# Patient Record
Sex: Male | Born: 1980 | Race: White | Hispanic: No | Marital: Married | Smoking: Former smoker
Health system: Southern US, Community
[De-identification: ages and names within clinical notes are randomized; demographics above are authoritative.]

## PROBLEM LIST (undated history)

## (undated) DIAGNOSIS — N289 Disorder of kidney and ureter, unspecified: Secondary | ICD-10-CM

## (undated) DIAGNOSIS — R918 Other nonspecific abnormal finding of lung field: Secondary | ICD-10-CM

## (undated) DIAGNOSIS — G118 Other hereditary ataxias: Secondary | ICD-10-CM

## (undated) HISTORY — DX: Other nonspecific abnormal finding of lung field: R91.8

## (undated) HISTORY — PX: HAND SURGERY: SHX662

---

## 2005-09-09 ENCOUNTER — Emergency Department: Payer: Self-pay | Admitting: Emergency Medicine

## 2006-09-26 ENCOUNTER — Emergency Department: Payer: Self-pay | Admitting: Emergency Medicine

## 2008-01-04 ENCOUNTER — Emergency Department: Payer: Self-pay | Admitting: Emergency Medicine

## 2010-02-26 ENCOUNTER — Emergency Department: Payer: Self-pay | Admitting: Emergency Medicine

## 2010-09-13 ENCOUNTER — Emergency Department: Payer: Self-pay | Admitting: Emergency Medicine

## 2010-10-14 ENCOUNTER — Emergency Department: Payer: Self-pay | Admitting: Emergency Medicine

## 2011-01-19 ENCOUNTER — Emergency Department: Payer: Self-pay | Admitting: Emergency Medicine

## 2013-04-15 ENCOUNTER — Emergency Department: Payer: Self-pay | Admitting: Emergency Medicine

## 2014-07-13 ENCOUNTER — Emergency Department: Payer: Self-pay | Admitting: Emergency Medicine

## 2015-06-10 ENCOUNTER — Encounter: Payer: Self-pay | Admitting: *Deleted

## 2015-06-10 ENCOUNTER — Emergency Department
Admission: EM | Admit: 2015-06-10 | Discharge: 2015-06-10 | Disposition: A | Discharge status: Home / Self Care | Payer: Self-pay | Attending: Emergency Medicine | Admitting: Emergency Medicine

## 2015-06-10 DIAGNOSIS — K029 Dental caries, unspecified: Secondary | ICD-10-CM | POA: Insufficient documentation

## 2015-06-10 DIAGNOSIS — Z720 Tobacco use: Secondary | ICD-10-CM | POA: Insufficient documentation

## 2015-06-10 DIAGNOSIS — K047 Periapical abscess without sinus: Secondary | ICD-10-CM

## 2015-06-10 MED ORDER — AMOXICILLIN 500 MG PO TABS
500.0000 mg | ORAL_TABLET | Freq: Three times a day (TID) | ORAL | Status: DC
Start: 2015-06-10 — End: 2019-03-20

## 2015-06-10 MED ORDER — HYDROCODONE-ACETAMINOPHEN 5-325 MG PO TABS: 1.0000 | ORAL_TABLET | ORAL | Status: DC | PRN

## 2015-06-10 NOTE — ED Notes (Signed)
Pt reports dental pain x 1 week, two days ago developed abscess to right upper side of mouth.

## 2015-06-10 NOTE — Discharge Instructions (Signed)
Dental Abscess °A dental abscess is a collection of infected fluid (pus) from a bacterial infection in the inner part of the tooth (pulp). It usually occurs at the end of the tooth's root.  °CAUSES  °· Severe tooth decay. °· Trauma to the tooth that allows bacteria to enter into the pulp, such as a broken or chipped tooth. °SYMPTOMS  °· Severe pain in and around the infected tooth. °· Swelling and redness around the abscessed tooth or in the mouth or face. °· Tenderness. °· Pus drainage. °· Bad breath. °· Bitter taste in the mouth. °· Difficulty swallowing. °· Difficulty opening the mouth. °· Nausea. °· Vomiting. °· Chills. °· Swollen neck glands. °DIAGNOSIS  °· A medical and dental history will be taken. °· An examination will be performed by tapping on the abscessed tooth. °· X-rays may be taken of the tooth to identify the abscess. °TREATMENT °The goal of treatment is to eliminate the infection. You may be prescribed antibiotic medicine to stop the infection from spreading. A root canal may be performed to save the tooth. If the tooth cannot be saved, it may be pulled (extracted) and the abscess may be drained.  °HOME CARE INSTRUCTIONS °· Only take over-the-counter or prescription medicines for pain, fever, or discomfort as directed by your caregiver. °· Rinse your mouth (gargle) often with salt water (¼ tsp salt in 8 oz [250 ml] of warm water) to relieve pain or swelling. °· Do not drive after taking pain medicine (narcotics). °· Do not apply heat to the outside of your face. °· Return to your dentist for further treatment as directed. °SEEK MEDICAL CARE IF: °· Your pain is not helped by medicine. °· Your pain is getting worse instead of better. °SEEK IMMEDIATE MEDICAL CARE IF: °· You have a fever or persistent symptoms for more than 2-3 days. °· You have a fever and your symptoms suddenly get worse. °· You have chills or a very bad headache. °· You have problems breathing or swallowing. °· You have trouble  opening your mouth. °· You have swelling in the neck or around the eye. °Document Released: 10/16/2005 Document Revised: 07/10/2012 Document Reviewed: 01/24/2011 °ExitCare® Patient Information ©2015 [employer]. This information is not intended to replace advice given to you by your health care provider. Make sure you discuss any questions you have with your health care provider. ° ° °OPTIONS FOR DENTAL FOLLOW UP CARE ° °[place] Department of Health and Human Services - Local Safety Net Dental Clinics °http://www.ncdhhs.gov/dph/oralhealth/services/safetynetclinics.htm °  °[facility] (336-562-3123) ° °[facility] [place] (919-933-9087) ° °[facility] (919-663-1744 ext 237) ° °[facility] (336-570-6415) ° °[facility] (919-968-2025) °This clinic caters to the indigent population and is on a lottery system. °Location: °[facility], [address], [place] °Clinic Hours: °Wednesdays from 6pm - 9pm, patients seen by a lottery system. °For dates, call or go to www.med.[facility].edu/[facility]/patients/Dental-[facility] °Services: °Cleanings, fillings and simple extractions. °Payment Options: °DENTAL WORK IS FREE OF CHARGE. Bring proof of income or support. °Best way to get seen: °Arrive at 5:15 pm - this is a lottery, NOT first come/first serve, so arriving earlier will not increase your chances of being seen. °  °  °[facility] °919-537-3737 °Select option 1 for emergencies °  °Location: °[facility], [address], [place] °Clinic Hours: °No walk-ins accepted - call the day before to schedule an appointment. °Check in times   are 9:30 am and 1:30 pm. °Services: °Simple extractions, temporary fillings, pulpectomy/pulp debridement, uncomplicated abscess drainage. °Payment Options: °PAYMENT IS DUE AT THE TIME OF SERVICE.  Fee is usually $100-200, additional surgical procedures (e.g. abscess drainage) may  be extra. °Cash, checks, Visa/MasterCard accepted.  Can file Medicaid if patient is covered for dental - patient should call case worker to check. °No discount for [facility] patients. °Best way to get seen: °MUST call the day before and get onto the schedule. Can usually be seen the next 1-2 days. No walk-ins accepted. °  °  °[facility] °919-933-9087 °  °Location: °[facility], [address], [place] °Clinic Hours: °M, W, Th, F 8am or 1:30pm, Tues 9a or 1:30 - first come/first served. °Services: °Simple extractions, temporary fillings, uncomplicated abscess drainage.  You do not need to be an [place] resident. °Payment Options: °PAYMENT IS DUE AT THE TIME OF SERVICE. °Dental insurance, otherwise sliding scale - bring proof of income or support. °Depending on income and treatment needed, cost is usually $50-200. °Best way to get seen: °Arrive early as it is first come/first served. °  °  °[facility] °919-542-1641 °  °Location: °[address] °Clinic Hours: °Mon-Thu 8a-5p °Services: °Most basic dental services including extractions and fillings. °Payment Options: °PAYMENT IS DUE AT THE TIME OF SERVICE. °Sliding scale, up to 50% off - bring proof if income or support. °Medicaid with dental option accepted. °Best way to get seen: °Call to schedule an appointment, can usually be seen within 2 weeks OR they will try to see walk-ins - show up at 8a or 2p (you may have to wait). °  °  °[facility] °919-245-2435 °[place] RESIDENTS ONLY °  °Location: °[facility], [address], [place], [place] [address] °Clinic Hours: By appointment only. °Monday - Thursday 8am-5pm, Friday 8am-12pm °Services: Cleanings, fillings, extractions. °Payment Options: °PAYMENT IS DUE AT THE TIME OF SERVICE. °Cash, Visa or MasterCard. Sliding scale - $30 minimum per service. °Best way to get seen: °Come in to  office, complete packet and make an appointment - need proof of income °or support monies for each household member and proof of [place] residence. °Usually takes about a month to get in. °  °  °[facility] °[phone] °  °Location: °[address] [place] °Clinic Hours: Walk-in Urgent Care Dental Services are offered Monday-Friday mornings only. °The numbers of emergencies accepted daily is limited to the number of °providers available. °Maximum 15 - Mondays, Wednesdays & Thursdays °Maximum 10 - Tuesdays & Fridays °Services: °You do not need to be a [place] resident to be seen for a dental emergency. °Emergencies are defined as pain, swelling, abnormal bleeding, or dental trauma. Walkins will receive x-rays if needed. °NOTE: Dental cleaning is not an emergency. °Payment Options: °PAYMENT IS DUE AT THE TIME OF SERVICE. °Minimum co-pay is $40.00 for uninsured patients. °Minimum co-pay is $3.00 for Medicaid with dental coverage. °Dental Insurance is accepted and must be presented at time of visit. °Medicare does not cover dental. °Forms of payment: Cash, credit card, checks. °Best way to get seen: °If not previously registered with the clinic, walk-in dental registration begins at 7:15 am and is on a first come/first serve basis. °If previously registered with the clinic, call to make an appointment. °  °  °[facility] °919-776-4359 °[place] RESIDENTS ONLY °  °Location: °[address],   [place], [place] °Clinic Hours: °Mon-Thu 10a-2p °Services: Extractions only! °Payment Options: °FREE (donations accepted) - bring proof of income or support °Best way to get seen: °Call and schedule an appointment OR come at 8am on the 1st Monday of every month (except for holidays) when it is first come/first served. °  °  °[facility] °[phone] °  °Location: °[address], [place] °Clinic Hours: °Friday mornings °Services, Payment Options, Best way to get  seen: °Call for info °

## 2015-06-10 NOTE — ED Provider Notes (Signed)
[facility] Emergency Department Provider Note ____________________________________________  Time seen: Approximately 2:57 PM  I have reviewed the triage vital signs and the nursing notes.   HISTORY  Chief Complaint Abscess   HPI Eric Webster is a 34 y.o. male who presents to the emergency department for dental pain for the past 7-10 days. He states that he has chronic dental pain that has suddenly worsened. He has not been taking anything for pain he has not been on any antibiotic over the last month. States that he cannot afford to go to the dentist.   History reviewed. No pertinent past medical history.  There are no active problems to display for this patient.   History reviewed. No pertinent past surgical history.  Current Outpatient Rx  Name  Route  Sig  Dispense  Refill  . amoxicillin (AMOXIL) 500 MG tablet   Oral   Take 1 tablet (500 mg total) by mouth 3 (three) times daily.   30 tablet   0   . HYDROcodone-acetaminophen (NORCO/VICODIN) 5-325 MG per tablet   Oral   Take 1 tablet by mouth every 4 (four) hours as needed.   12 tablet   0     Allergies Review of patient's allergies indicates no known allergies.  No family history on file.  Social History Social History  Substance Use Topics  . Smoking status: Current Every Day Smoker  . Smokeless tobacco: None  . Alcohol Use: Yes    Review of Systems Constitutional: No fever/chills Eyes: No visual changes. ENT: No sore throat. Cardiovascular: Denies chest pain. Respiratory: Denies shortness of breath. Gastrointestinal: No abdominal pain.  No nausea, no vomiting.  Genitourinary: Negative for dysuria. Musculoskeletal: Negative for back pain. Skin: Negative for rash. Neurological: Negative for headaches, focal weakness or numbness. 10-point ROS otherwise negative.  ____________________________________________   PHYSICAL EXAM:  VITAL SIGNS: ED Triage Vitals  Enc  Vitals Group     BP 06/10/15 1354 147/92 mmHg     Pulse Rate 06/10/15 1354 91     Resp 06/10/15 1354 16     Temp 06/10/15 1354 98.5 F (36.9 C)     Temp Source 06/10/15 1354 Oral     SpO2 06/10/15 1354 99 %     Weight 06/10/15 1408 190 lb (86.183 kg)     Height 06/10/15 1408 6' (1.829 m)     Head Cir --      Peak Flow --      Pain Score 06/10/15 1353 10     Pain Loc --      Pain Edu? --      Excl. in GC? --     Constitutional: Alert and oriented. Well appearing and in no acute distress. Eyes: Conjunctivae are normal. PERRL. EOMI. Head: Atraumatic. Nose: No congestion/rhinnorhea. Mouth/Throat: Mucous membranes are moist.  Oropharynx non-erythematous. Periodontal Exam: Widespread chronic dental decay with swelling around gumline to numbers 2 and 3. Neck: No stridor.  Hematological/Lymphatic/Immunilogical: No cervical lymphadenopathy. Cardiovascular:   Good peripheral circulation. Respiratory: Normal respiratory effort.  No retractions. Musculoskeletal: No lower extremity tenderness nor edema.  No joint effusions. Neurologic:  Normal speech and language. No gross focal neurologic deficits are appreciated. Speech is normal. No gait instability. Skin:  Skin is warm, dry and intact. No rash noted. Psychiatric: Mood and affect are normal. Speech and behavior are normal.  ____________________________________________   LABS (all labs ordered are listed, but only abnormal results are displayed)  Labs Reviewed - No data  to display ____________________________________________   RADIOLOGY  Not indicated ____________________________________________   PROCEDURES  Procedure(s) performed: None  Critical Care performed: No  ____________________________________________   INITIAL IMPRESSION / ASSESSMENT AND PLAN / ED COURSE  Pertinent labs & imaging results that were available during my care of the patient were reviewed by me and considered in my medical decision making (see  chart for details).  Patient was advised to see the dentist within 14 days. Also advised to take the antibiotic until finished. Instructed to return to the ER for symptoms that change or worsen if you are unable to schedule an appointment. ____________________________________________   FINAL CLINICAL IMPRESSION(S) / ED DIAGNOSES  Final diagnoses:  Dental abscess       Chinita Pester, FNP 06/10/15 1459  Emily Filbert, MD 06/10/15 1558

## 2015-12-29 ENCOUNTER — Emergency Department
Admission: EM | Admit: 2015-12-29 | Discharge: 2015-12-29 | Disposition: A | Discharge status: Home / Self Care | Payer: Self-pay | Attending: Emergency Medicine | Admitting: Emergency Medicine

## 2015-12-29 ENCOUNTER — Encounter: Payer: Self-pay | Admitting: Emergency Medicine

## 2015-12-29 DIAGNOSIS — B349 Viral infection, unspecified: Secondary | ICD-10-CM | POA: Insufficient documentation

## 2015-12-29 DIAGNOSIS — F17210 Nicotine dependence, cigarettes, uncomplicated: Secondary | ICD-10-CM | POA: Insufficient documentation

## 2015-12-29 DIAGNOSIS — Z792 Long term (current) use of antibiotics: Secondary | ICD-10-CM | POA: Insufficient documentation

## 2015-12-29 LAB — COMPREHENSIVE METABOLIC PANEL
ALK PHOS: 90 U/L (ref 38–126)
ALT: 24 U/L (ref 17–63)
AST: 25 U/L (ref 15–41)
Albumin: 4.1 g/dL (ref 3.5–5.0)
Anion gap: 7 (ref 5–15)
BUN: 11 mg/dL (ref 6–20)
CALCIUM: 9.1 mg/dL (ref 8.9–10.3)
CO2: 24 mmol/L (ref 22–32)
CREATININE: 1.01 mg/dL (ref 0.61–1.24)
Chloride: 104 mmol/L (ref 101–111)
GFR calc non Af Amer: >60 mL/min (ref >60)
Glucose, Bld: 119 mg/dL — ABNORMAL HIGH (ref 65–99)
Potassium: 3.8 mmol/L (ref 3.5–5.1)
SODIUM: 135 mmol/L (ref 135–145)
TOTAL PROTEIN: 8 g/dL (ref 6.5–8.1)
Total Bilirubin: 0.8 mg/dL (ref 0.3–1.2)

## 2015-12-29 LAB — RAPID INFLUENZA A&B ANTIGENS: Influenza A (ARMC): NEGATIVE

## 2015-12-29 LAB — CBC
HCT: 47.3 % (ref 40.0–52.0)
Hemoglobin: 16.1 g/dL (ref 13.0–18.0)
MCH: 29.4 pg (ref 26.0–34.0)
MCHC: 34.1 g/dL (ref 32.0–36.0)
MCV: 86.3 fL (ref 80.0–100.0)
PLATELETS: 204 10*3/uL (ref 150–440)
RBC: 5.48 MIL/uL (ref 4.40–5.90)
RDW: 13.4 % (ref 11.5–14.5)
WBC: 7.9 10*3/uL (ref 3.8–10.6)

## 2015-12-29 LAB — URINALYSIS COMPLETE WITH MICROSCOPIC (ARMC ONLY)
Bilirubin Urine: NEGATIVE
Glucose, UA: NEGATIVE mg/dL
HGB URINE DIPSTICK: NEGATIVE
Leukocytes, UA: NEGATIVE
NITRITE: NEGATIVE
PH: 5 (ref 5.0–8.0)
Protein, ur: 30 mg/dL — AB
Specific Gravity, Urine: 1.03 (ref 1.005–1.030)

## 2015-12-29 LAB — LIPASE, BLOOD: LIPASE: 20 U/L (ref 11–51)

## 2015-12-29 LAB — RAPID INFLUENZA A&B ANTIGENS (ARMC ONLY): INFLUENZA B (ARMC): NEGATIVE

## 2015-12-29 MED ORDER — ONDANSETRON 4 MG PO TBDP
ORAL_TABLET | ORAL | Status: AC
  Filled 2015-12-29: qty 1

## 2015-12-29 MED ORDER — METOCLOPRAMIDE HCL 10 MG PO TABS
10.0000 mg | ORAL_TABLET | Freq: Four times a day (QID) | ORAL | Status: DC | PRN
Start: 2015-12-29 — End: 2019-03-20

## 2015-12-29 MED ORDER — ONDANSETRON 4 MG PO TBDP
4.0000 mg | ORAL_TABLET | Freq: Once | ORAL | Status: AC
  Administered 2015-12-29: 4 mg via ORAL

## 2015-12-29 NOTE — ED Notes (Signed)
Pt reports vomiting, diarrhea since Monday. Pt states 3x vomiting today, with 2x diarrhea. Pt states fever over 101 yesterday, normal today.

## 2015-12-29 NOTE — ED Provider Notes (Signed)
[facility] Emergency Department Provider Note  ____________________________________________  Time seen: Approximately 230 PM  I have reviewed the triage vital signs and the nursing notes.   HISTORY  Chief Complaint Fever; Emesis; and Diarrhea    HPI Eric Webster is a 35 y.o. male without any chronic medical problems was presenting today with fever, body aches, nausea vomiting and diarrhea. He says the symptoms began this past Sunday. Multiple people have been sick in his family. He denies any of them having a formal diagnosis of influenza.Says that he also has associated runny nose and cough. No abdominal pain. Says that he had a fever to 101 yesterday but it has abated today. No blood or bilious vomitus. No blood in his diarrhea. Vomited 3-4 times today and one or 2 episodes of diarrhea earlier today. No recent travel or antibiotics. No camping or drinking from streams. Also with some mild lightheadedness when going from sitting to standing position. Also associated with generalized weakness.   Says that he does not carry a formal diagnosis of hypertension but says that every time she is a Dr. Jennette Banker comment about his blood pressure being elevated.   History reviewed. No pertinent past medical history.  There are no active problems to display for this patient.   Past Surgical History  Procedure Laterality Date  . Hand surgery Right     Current Outpatient Rx  Name  Route  Sig  Dispense  Refill  . amoxicillin (AMOXIL) 500 MG tablet   Oral   Take 1 tablet (500 mg total) by mouth 3 (three) times daily.   30 tablet   0   . HYDROcodone-acetaminophen (NORCO/VICODIN) 5-325 MG per tablet   Oral   Take 1 tablet by mouth every 4 (four) hours as needed.   12 tablet   0     Allergies Review of patient's allergies indicates no known allergies.  No family history on file.  Social History Social History  Substance Use Topics  . Smoking status:  Current Every Day Smoker -- 1.50 packs/day    Types: Cigarettes  . Smokeless tobacco: None  . Alcohol Use: Yes     Comment: 1 pint liquor every weekend    Review of Systems Constitutional:  fever/chills Eyes: No visual changes. ENT: No sore throat. Cardiovascular: Denies chest pain. Respiratory: Dry cough Gastrointestinal: No abdominal pain.   No constipation. Genitourinary: Negative for dysuria. Musculoskeletal: Negative for back pain. Skin: Negative for rash. Neurological: Negative for headaches, focal weakness or numbness.  10-point ROS otherwise negative.  ____________________________________________   PHYSICAL EXAM:  VITAL SIGNS: ED Triage Vitals  Enc Vitals Group     BP 12/29/15 1353 167/144 mmHg     Pulse Rate 12/29/15 1353 95     Resp --      Temp 12/29/15 1353 98.4 F (36.9 C)     Temp Source 12/29/15 1353 Oral     SpO2 12/29/15 1353 96 %     Weight 12/29/15 1353 200 lb (90.719 kg)     Height 12/29/15 1353 6' (1.829 m)     Head Cir --      Peak Flow --      Pain Score --      Pain Loc --      Pain Edu? --      Excl. in GC? --     Constitutional: Alert and oriented. Well appearing and in no acute distress. Eyes: Conjunctivae are normal. PERRL. EOMI. Head: Atraumatic.  Nose: No congestion/rhinnorhea. Mouth/Throat: Mucous membranes are moist.  Oropharynx non-erythematous. Neck: No stridor.   Cardiovascular: Normal rate, regular rhythm. Grossly normal heart sounds.  Good peripheral circulation. Respiratory: Normal respiratory effort.  No retractions. Lungs CTAB. Gastrointestinal: Soft and nontender. No distention. no CVA tenderness. Musculoskeletal: No lower extremity tenderness nor edema.  No joint effusions. Neurologic:  Normal speech and language. No gross focal neurologic deficits are appreciated. No gait instability. Skin:  Skin is warm, dry and intact. No rash noted. Psychiatric: Mood and affect are normal. Speech and behavior are  normal.  ____________________________________________   LABS (all labs ordered are listed, but only abnormal results are displayed)  Labs Reviewed  COMPREHENSIVE METABOLIC PANEL - Abnormal; Notable for the following:    Glucose, Bld 119 (*)    All other components within normal limits  URINALYSIS COMPLETEWITH MICROSCOPIC ([facility] ONLY) - Abnormal; Notable for the following:    Color, Urine AMBER (*)    APPearance CLEAR (*)    Ketones, ur TRACE (*)    Protein, ur 30 (*)    Bacteria, UA RARE (*)    Squamous Epithelial / LPF 0-5 (*)    All other components within normal limits  RAPID INFLUENZA A&B ANTIGENS ([facility] ONLY)  LIPASE, BLOOD  CBC   ____________________________________________  EKG   ____________________________________________  RADIOLOGY   ____________________________________________   PROCEDURES   ____________________________________________   INITIAL IMPRESSION / ASSESSMENT AND PLAN / ED COURSE  Pertinent labs & imaging results that were available during my care of the patient were reviewed by me and considered in my medical decision making (see chart for details).  ----------------------------------------- 3:17 PM on 12/29/2015 -----------------------------------------  Patient is resting comfortably this time. After Zofran and is able to tolerate by mouth fluids. He denies any nausea at this time. Very reassuring lab workup including a negative rapid flu screen. Likely viral illness. I counseled the patient to make sure to stay hydrated with plenty of fluids at home. Also recheck his blood pressure which was 109/77. I will not start him on antihypertensive at this point because of the second blood pressure. However, we did discuss following up at an outpatient appointment. He'll be given the [facility] clinic contact information for follow-up. I will also discharge him home with Reglan. ____________________________________________   FINAL CLINICAL IMPRESSION(S)  / ED DIAGNOSES  Viral syndrome    Myrna Blazer, MD 12/29/15 [phone]

## 2015-12-29 NOTE — ED Notes (Signed)
Pt with fever and feeling ill starting on Sunday.  Nausea, vomiting and diarrhea started 1 day ago.  Pt states he has thrown up 3-4 times today.

## 2015-12-29 NOTE — ED Notes (Signed)
Pt discharged home after verbalizing understanding of discharge instructions; nad noted. 

## 2015-12-29 NOTE — ED Notes (Signed)
Called in waiting room with no answer 

## 2016-10-24 ENCOUNTER — Emergency Department
Admission: EM | Admit: 2016-10-24 | Discharge: 2016-10-24 | Disposition: A | Discharge status: Home / Self Care | Payer: Medicaid Other | Attending: Emergency Medicine | Admitting: Emergency Medicine

## 2016-10-24 DIAGNOSIS — F17210 Nicotine dependence, cigarettes, uncomplicated: Secondary | ICD-10-CM | POA: Insufficient documentation

## 2016-10-24 DIAGNOSIS — K047 Periapical abscess without sinus: Secondary | ICD-10-CM | POA: Insufficient documentation

## 2016-10-24 DIAGNOSIS — Z79899 Other long term (current) drug therapy: Secondary | ICD-10-CM | POA: Insufficient documentation

## 2016-10-24 MED ORDER — IBUPROFEN 600 MG PO TABS: 600.0000 mg | ORAL_TABLET | Freq: Three times a day (TID) | ORAL | 0 refills | Status: DC | PRN

## 2016-10-24 MED ORDER — AMOXICILLIN 500 MG PO CAPS: 500.0000 mg | ORAL_CAPSULE | Freq: Three times a day (TID) | ORAL | 0 refills | Status: DC

## 2016-10-24 MED ORDER — IBUPROFEN 600 MG PO TABS
600.0000 mg | ORAL_TABLET | Freq: Once | ORAL | Status: AC
  Administered 2016-10-24: 600 mg via ORAL
  Filled 2016-10-24: qty 1

## 2016-10-24 MED ORDER — LIDOCAINE VISCOUS 2 % MT SOLN
15.0000 mL | Freq: Once | OROMUCOSAL | Status: AC
  Administered 2016-10-24: 15 mL via OROMUCOSAL
  Filled 2016-10-24: qty 15

## 2016-10-24 MED ORDER — OXYCODONE-ACETAMINOPHEN 5-325 MG PO TABS
1.0000 | ORAL_TABLET | Freq: Once | ORAL | Status: AC
  Administered 2016-10-24: 1 via ORAL
  Filled 2016-10-24: qty 1

## 2016-10-24 MED ORDER — TRAMADOL HCL 50 MG PO TABS: 50.0000 mg | ORAL_TABLET | Freq: Four times a day (QID) | ORAL | 0 refills | Status: AC | PRN

## 2016-10-24 MED ORDER — AMOXICILLIN 500 MG PO CAPS
500.0000 mg | ORAL_CAPSULE | Freq: Once | ORAL | Status: AC
  Administered 2016-10-24: 500 mg via ORAL
  Filled 2016-10-24: qty 1

## 2016-10-24 NOTE — ED Triage Notes (Signed)
Dental pain to right side of mouth; has not seen dentist. Pain X 1 week to right side of face, swelling. No RR distress. Pt alert and oriented X4, active, cooperative, pt in NAD. RR even and unlabored, color WNL.

## 2016-10-24 NOTE — Discharge Instructions (Signed)
OPTIONS FOR DENTAL FOLLOW UP CARE ° °[place] Department of Health and Human Services - Local Safety Net Dental Clinics °http://www.ncdhhs.gov/dph/oralhealth/services/safetynetclinics.htm °  °[facility] (336-562-3123) ° °[facility] [place] ([phone]) ° °[facility] ([phone] ext [phone]) ° ° [facility] ([phone]) ° °[facility] ([phone]) °This clinic caters to the indigent population and is on a lottery system. °Location: °[facility], [address], [place] °Clinic Hours: °Wednesdays from 6pm - 9pm, patients seen by a lottery system. °For dates, call or go to [email or web address] °Services: °Cleanings, fillings and simple extractions. °Payment Options: °DENTAL WORK IS FREE OF CHARGE. Bring proof of income or support. °Best way to get seen: °Arrive at 5:15 pm - this is a lottery, NOT first come/first serve, so arriving earlier will not increase your chances of being seen. °  °  °[facility] °[phone] °Select option 1 for emergencies °  °Location: °[facility], [address], [place] °Clinic Hours: °No walk-ins accepted - call the day before to schedule an appointment. °Check in times are 9:30 am and 1:30 pm. °Services: °Simple extractions, temporary fillings, pulpectomy/pulp debridement, uncomplicated abscess drainage. °Payment Options: °PAYMENT IS DUE AT THE TIME OF SERVICE.  Fee is usually $100-200, additional surgical procedures (e.g. abscess drainage) may be extra. °Cash, checks, Visa/MasterCard accepted.  Can file Medicaid if patient is covered for dental - patient should call case worker to check. °No discount for [facility] patients. °Best way to get seen: °MUST call the day before and get onto the schedule. Can usually be seen the next 1-2 days. No walk-ins accepted. °  °  °[facility] °[phone] °   °Location: °[facility], [address], [place] °Clinic Hours: °M, W, Th, F 8am or 1:30pm, Tues 9a or 1:30 - first come/first served. °Services: °Simple extractions, temporary fillings, uncomplicated abscess drainage.  You do not need to be an [place] resident. °Payment Options: °PAYMENT IS DUE AT THE TIME OF SERVICE. °Dental insurance, otherwise sliding scale - bring proof of income or support. °Depending on income and treatment needed, cost is usually $50-200. °Best way to get seen: °Arrive early as it is first come/first served. °  °  °[facility] °[phone] °  °Location: °[address] °Clinic Hours: °Mon-Thu 8a-5p °Services: °Most basic dental services including extractions and fillings. °Payment Options: °PAYMENT IS DUE AT THE TIME OF SERVICE. °Sliding scale, up to 50% off - bring proof if income or support. °Medicaid with dental option accepted. °Best way to get seen: °Call to schedule an appointment, can usually be seen within 2 weeks OR they will try to see walk-ins - show up at 8a or 2p (you may have to wait). °  °  °[facility] °[phone] °[place] RESIDENTS ONLY °  °Location: °[facility], [address], [place], [place] [address] °Clinic Hours: By appointment only. °Monday - Thursday 8am-5pm, Friday 8am-12pm °Services: Cleanings, fillings, extractions. °Payment Options: °PAYMENT IS DUE AT THE TIME OF SERVICE. °Cash, Visa or MasterCard. Sliding scale - $30 minimum per service. °Best way to get seen: °Come in to office, complete packet and make an appointment - need proof of income °or support monies for each household member and proof of [place] residence. °Usually takes about a month to get in. °  °  °[facility] °[phone] °  °Location: °[address]   [place] °Clinic Hours: Walk-in Urgent Care Dental Services are offered Monday-Friday  mornings only. °The numbers of emergencies accepted daily is limited to the number of °providers available. °Maximum 15 - Mondays, Wednesdays & Thursdays °Maximum 10 - Tuesdays & Fridays °Services: °You do not need to be a [place] resident to be seen for a dental emergency. °Emergencies are defined as pain, swelling, abnormal bleeding, or dental trauma. Walkins will receive x-rays if needed. °NOTE: Dental cleaning is not an emergency. °Payment Options: °PAYMENT IS DUE AT THE TIME OF SERVICE. °Minimum co-pay is $40.00 for uninsured patients. °Minimum co-pay is $3.00 for Medicaid with dental coverage. °Dental Insurance is accepted and must be presented at time of visit. °Medicare does not cover dental. °Forms of payment: Cash, credit card, checks. °Best way to get seen: °If not previously registered with the clinic, walk-in dental registration begins at 7:15 am and is on a first come/first serve basis. °If previously registered with the clinic, call to make an appointment. °  °  °[facility] °[phone] °[place] RESIDENTS ONLY °  °Location: °[address], [place], [place] °Clinic Hours: °Mon-Thu 10a-2p °Services: Extractions only! °Payment Options: °FREE (donations accepted) - bring proof of income or support °Best way to get seen: °Call and schedule an appointment OR come at 8am on the 1st Monday of every month (except for holidays) when it is first come/first served. °  °  °[facility] °[phone] °  °Location: °[address], [place] °Clinic Hours: °Friday mornings °Services, Payment Options, Best way to get seen: °Call for info °

## 2016-10-24 NOTE — ED Notes (Signed)
Pt offered wheelchair, denies.  

## 2016-10-24 NOTE — ED Notes (Signed)
Pt complains of right sided mouth pain/ dental pain, pt denies fever, right side of face is swollen

## 2016-10-24 NOTE — ED Provider Notes (Signed)
[facility] Emergency Department Provider Note   ____________________________________________   None    (approximate)  I have reviewed the triage vital signs and the nursing notes.   HISTORY  Chief Complaint Dental Pain    HPI Eric Webster is a 35 y.o. male patient complain of pain and edema to the right side of his mouth for 1 week. Patient has a history of devitalized fractured teeth. Patient has not seen a dentist secondary to lack of insurance. Patient denies any fever associated this complaint. Patient described a pain as "sharp".Patient rates the pain as a 10 over 10. No palliative measures for this complaint.   History reviewed. No pertinent past medical history.  There are no active problems to display for this patient.   Past Surgical History:  Procedure Laterality Date  . HAND SURGERY Right     Prior to Admission medications   Medication Sig Start Date End Date Taking? Authorizing Provider  amoxicillin (AMOXIL) 500 MG capsule Take 1 capsule (500 mg total) by mouth 3 (three) times daily. 10/24/16   Joni Reiningonald K Smith, PA-C  amoxicillin (AMOXIL) 500 MG tablet Take 1 tablet (500 mg total) by mouth 3 (three) times daily. 06/10/15   Chinita Pesterari B Triplett, FNP  HYDROcodone-acetaminophen (NORCO/VICODIN) 5-325 MG per tablet Take 1 tablet by mouth every 4 (four) hours as needed. 06/10/15   Chinita Pesterari B Triplett, FNP  ibuprofen (ADVIL,MOTRIN) 600 MG tablet Take 1 tablet (600 mg total) by mouth every 8 (eight) hours as needed. 10/24/16   Joni Reiningonald K Smith, PA-C  metoCLOPramide (REGLAN) 10 MG tablet Take 1 tablet (10 mg total) by mouth every 6 (six) hours as needed. 12/29/15   Myrna Blazeravid Matthew Schaevitz, MD  traMADol (ULTRAM) 50 MG tablet Take 1 tablet (50 mg total) by mouth every 6 (six) hours as needed. 10/24/16 10/24/17  Joni Reiningonald K Smith, PA-C    Allergies Patient has no known allergies.  No family history on file.  Social History Social History  Substance Use  Topics  . Smoking status: Current Every Day Smoker    Packs/day: 1.50    Types: Cigarettes  . Smokeless tobacco: Not on file  . Alcohol use Yes     Comment: 1 pint liquor every weekend    Review of Systems Constitutional: No fever/chills Eyes: No visual changes. ZOX:WRUEAVENT:Dental pain. Cardiovascular: Denies chest pain. Respiratory: Denies shortness of breath. Gastrointestinal: No abdominal pain.  No nausea, no vomiting.  No diarrhea.  No constipation. Genitourinary: Negative for dysuria. Musculoskeletal: Negative for back pain. Skin: Negative for rash. Neurological: Negative for headaches, focal weakness or numbness.    ____________________________________________   PHYSICAL EXAM:  VITAL SIGNS: ED Triage Vitals  Enc Vitals Group     BP 10/24/16 1303 133/78     Pulse Rate 10/24/16 1303 (!) 114     Resp --      Temp 10/24/16 1303 98.3 F (36.8 C)     Temp Source 10/24/16 1303 Oral     SpO2 10/24/16 1303 97 %     Weight 10/24/16 1303 210 lb (95.3 kg)     Height 10/24/16 1303 6' (1.829 m)     Head Circumference --      Peak Flow --      Pain Score 10/24/16 1257 8     Pain Loc --      Pain Edu? --      Excl. in GC? --     Constitutional: Alert and oriented. Well appearing  and in no acute distress. Eyes: Conjunctivae are normal. PERRL. EOMI. Head: Atraumatic. Nose: No congestion/rhinnorhea. Mouth/Throat: Multiple caries and fractured teeth. Edematous gingiva right molar area.  Neck: No stridor.  No cervical spine tenderness to palpation. Hematological/Lymphatic/Immunilogical: No cervical lymphadenopathy. Cardiovascular: Normal rate, regular rhythm. Grossly normal heart sounds.  Good peripheral circulation. Tachycardic Respiratory: Normal respiratory effort.  No retractions. Lungs CTAB. Gastrointestinal: Soft and nontender. No distention. No abdominal bruits. No CVA tenderness. Musculoskeletal: No lower extremity tenderness nor edema.  No joint effusions. Neurologic:   Normal speech and language. No gross focal neurologic deficits are appreciated. No gait instability. Skin:  Skin is warm, dry and intact. No rash noted. Psychiatric: Mood and affect are normal. Speech and behavior are normal.  ____________________________________________   LABS (all labs ordered are listed, but only abnormal results are displayed)  Labs Reviewed - No data to display ____________________________________________  EKG   ____________________________________________  RADIOLOGY   ____________________________________________   PROCEDURES  Procedure(s) performed: None  Procedures  Critical Care performed: No  ____________________________________________   INITIAL IMPRESSION / ASSESSMENT AND PLAN / ED COURSE  Pertinent labs & imaging results that were available during my care of the patient were reviewed by me and considered in my medical decision making (see chart for details).  Dental abscess. Patient given discharge care instruction. Patient provided a list of dental clinics to contact for definitive evaluation and treatment. Patient given a prescription for amoxicillin, Percocet, and ibuprofen.  Clinical Course      ____________________________________________   FINAL CLINICAL IMPRESSION(S) / ED DIAGNOSES  Final diagnoses:  Dental abscess      NEW MEDICATIONS STARTED DURING THIS VISIT:  New Prescriptions   AMOXICILLIN (AMOXIL) 500 MG CAPSULE    Take 1 capsule (500 mg total) by mouth 3 (three) times daily.   IBUPROFEN (ADVIL,MOTRIN) 600 MG TABLET    Take 1 tablet (600 mg total) by mouth every 8 (eight) hours as needed.   TRAMADOL (ULTRAM) 50 MG TABLET    Take 1 tablet (50 mg total) by mouth every 6 (six) hours as needed.     Note:  This document was prepared using Dragon voice recognition software and may include unintentional dictation errors.    Joni ReiningRonald K Smith, PA-C 10/24/16 1400    Governor Rooksebecca Lord, MD 10/24/16 [phone]

## 2017-08-30 ENCOUNTER — Emergency Department
Admission: EM | Admit: 2017-08-30 | Discharge: 2017-08-30 | Disposition: A | Discharge status: Home / Self Care | Payer: Self-pay | Attending: Emergency Medicine | Admitting: Emergency Medicine

## 2017-08-30 ENCOUNTER — Emergency Department: Payer: Self-pay

## 2017-08-30 ENCOUNTER — Encounter: Payer: Self-pay | Admitting: Emergency Medicine

## 2017-08-30 DIAGNOSIS — F17210 Nicotine dependence, cigarettes, uncomplicated: Secondary | ICD-10-CM | POA: Insufficient documentation

## 2017-08-30 DIAGNOSIS — R42 Dizziness and giddiness: Secondary | ICD-10-CM | POA: Insufficient documentation

## 2017-08-30 DIAGNOSIS — M25511 Pain in right shoulder: Secondary | ICD-10-CM | POA: Insufficient documentation

## 2017-08-30 LAB — BASIC METABOLIC PANEL
ANION GAP: 7 (ref 5–15)
BUN: 13 mg/dL (ref 6–20)
CHLORIDE: 103 mmol/L (ref 101–111)
CO2: 26 mmol/L (ref 22–32)
Calcium: 9.4 mg/dL (ref 8.9–10.3)
Creatinine, Ser: 0.89 mg/dL (ref 0.61–1.24)
GFR calc Af Amer: >60 mL/min (ref >60)
GFR calc non Af Amer: >60 mL/min (ref >60)
GLUCOSE: 107 mg/dL — AB (ref 65–99)
POTASSIUM: 4.3 mmol/L (ref 3.5–5.1)
Sodium: 136 mmol/L (ref 135–145)

## 2017-08-30 LAB — CBC
HEMATOCRIT: 48.7 % (ref 40.0–52.0)
HEMOGLOBIN: 16.8 g/dL (ref 13.0–18.0)
MCH: 30.7 pg (ref 26.0–34.0)
MCHC: 34.6 g/dL (ref 32.0–36.0)
MCV: 88.7 fL (ref 80.0–100.0)
Platelets: 230 10*3/uL (ref 150–440)
RBC: 5.49 MIL/uL (ref 4.40–5.90)
RDW: 12.9 % (ref 11.5–14.5)
WBC: 7.9 10*3/uL (ref 3.8–10.6)

## 2017-08-30 LAB — TROPONIN I: Troponin I: <0.03 ng/mL (ref <0.03)

## 2017-08-30 MED ORDER — OXYCODONE-ACETAMINOPHEN 5-325 MG PO TABS
2.0000 | ORAL_TABLET | Freq: Once | ORAL | Status: AC
  Administered 2017-08-30: 2 via ORAL
  Filled 2017-08-30: qty 2

## 2017-08-30 MED ORDER — DIAZEPAM 5 MG PO TABS: 5.0000 mg | ORAL_TABLET | Freq: Three times a day (TID) | ORAL | 0 refills | Status: DC | PRN

## 2017-08-30 NOTE — ED Notes (Signed)
ED Provider at bedside. 

## 2017-08-30 NOTE — ED Provider Notes (Signed)
[facility] Emergency Department Provider Note       Time seen: ----------------------------------------- 3:13 PM on 08/30/2017 -----------------------------------------     I have reviewed the triage vital signs and the nursing notes.   HISTORY   Chief Complaint Dizziness    HPI Eric Webster is a 36 y.o. male with no significant past medical history who presents to the ED for dizziness with past 6 months as well as right arm for 2 months. He denies any other symptoms. Patient states dizziness is gotten annoying which has prompted him to come in. Pain in the right arm is 5 out of 10, nothing makes it better or worse. Patient also has tenderness and around the right shoulder.  History reviewed. No pertinent past medical history.  There are no active problems to display for this patient.   Past Surgical History:  Procedure Laterality Date  . HAND SURGERY Right     Allergies Patient has no known allergies.  Social History Social History  Substance Use Topics  . Smoking status: Current Every Day Smoker    Packs/day: 1.50    Types: Cigarettes  . Smokeless tobacco: Not on file  . Alcohol use Yes     Comment: 1 pint liquor every weekend    Review of Systems Constitutional: Negative for fever. Eyes: Negative for vision changes ENT:  Negative for congestion, sore throat Cardiovascular: Negative for chest pain. Respiratory: Negative for shortness of breath. Gastrointestinal: Negative for abdominal pain, vomiting and diarrhea. Genitourinary: Negative for dysuria. Musculoskeletal: Positive for right arm pain Skin: Negative for rash. Neurological: Negative for headaches, focal weakness or numbness. Positive for dizziness  All systems negative/normal/unremarkable except as stated in the HPI  ____________________________________________   PHYSICAL EXAM:  VITAL SIGNS: ED Triage Vitals  Enc Vitals Group     BP 08/30/17 1404 (!)  160/88     Pulse Rate 08/30/17 1404 81     Resp 08/30/17 1404 18     Temp 08/30/17 1404 97.8 F (36.6 C)     Temp Source 08/30/17 1404 Oral     SpO2 08/30/17 1404 97 %     Weight 08/30/17 1403 225 lb (102.1 kg)     Height 08/30/17 1403 6' (1.829 m)     Head Circumference --      Peak Flow --      Pain Score 08/30/17 1403 5     Pain Loc --      Pain Edu? --      Excl. in GC? --     Constitutional: Alert and oriented. Well appearing and in no distress. Eyes: Conjunctivae are normal. Normal extraocular movements. ENT   Head: Normocephalic and atraumatic.   Nose: No congestion/rhinnorhea.   Mouth/Throat: Mucous membranes are moist.   Neck: No stridor. Cardiovascular: Normal rate, regular rhythm. No murmurs, rubs, or gallops. Respiratory: Normal respiratory effort without tachypnea nor retractions. Breath sounds are clear and equal bilaterally. No wheezes/rales/rhonchi. Gastrointestinal: Soft and nontender. Normal bowel sounds Musculoskeletal: Tenderness and around the right shoulder, particularly the right medial scapula Neurologic:  Normal speech and language. No gross focal neurologic deficits are appreciated.  Skin:  Skin is warm, dry and intact. No rash noted. Psychiatric: Mood and affect are normal. Speech and behavior are normal.  ____________________________________________  EKG: Interpreted by me. Sinus rhythm with a rate of 80 bpm, normal PR interval, normal QRS, normal QT. Nonspecific T-wave changes  ____________________________________________  ED COURSE:  Pertinent labs & imaging results that  were available during my care of the patient were reviewed by me and considered in my medical decision making (see chart for details). Patient presents for dizziness and right arm pain, we will assess with labs and imaging as indicated. Clinical Course as of Aug 30 1614  Thu Aug 30, 2017  1525 Patient's heart rate increased by 20 bpm when he stood up but his blood  pressure was unchanged  [JW]  1533 No significant change in blood pressure in the right and left arm  [JW]    Clinical Course User Index [JW] Emily FilbertWilliams, Jonathan E, MD   Procedures ____________________________________________   LABS (pertinent positives/negatives)  Labs Reviewed  BASIC METABOLIC PANEL - Abnormal; Notable for the following:       Result Value   Glucose, Bld 107 (*)    All other components within normal limits  CBC  TROPONIN I  URINALYSIS, COMPLETE (UACMP) WITH MICROSCOPIC    RADIOLOGY  Shoulder x-ray was unremarkable  ____________________________________________  DIFFERENTIAL DIAGNOSIS   Dehydration, anemia, electrolyte abnormality, arrhythmia, subclavian steal   FINAL ASSESSMENT AND PLAN  Dizziness, right shoulder pain  Plan: Patient had presented for dizziness of uncertain etiology. Patients labs were reassuring. Patients imaging was reassuring as well. He does have some nonspecific T-wave inversions on his EKG. He is a heavy smoker and drinker swallow advised tapering down his alcohol and tobacco intake. He'll be referred to cardiology for close outpatient follow-up.   Emily FilbertWilliams, Jonathan E, MD   Note: This note was generated in part or whole with voice recognition software. Voice recognition is usually quite accurate but there are transcription errors that can and very often do occur. I apologize for any typographical errors that were not detected and corrected.     Emily FilbertWilliams, Jonathan E, MD 08/30/17 [phone]

## 2017-08-30 NOTE — ED Triage Notes (Signed)
Pt reports dizziness for six months and right arm pain for two months. Pt denies any other symptoms. Pt states the dizziness has just gotten annoying which prompted him to come in.

## 2018-01-08 DIAGNOSIS — G118 Other hereditary ataxias: Secondary | ICD-10-CM | POA: Insufficient documentation

## 2018-09-02 ENCOUNTER — Other Ambulatory Visit: Payer: Self-pay

## 2018-09-02 ENCOUNTER — Emergency Department: Payer: Self-pay

## 2018-09-02 ENCOUNTER — Emergency Department
Admission: EM | Admit: 2018-09-02 | Discharge: 2018-09-02 | Disposition: A | Discharge status: Home / Self Care | Payer: Self-pay | Attending: Emergency Medicine | Admitting: Emergency Medicine

## 2018-09-02 ENCOUNTER — Encounter: Payer: Self-pay | Admitting: Emergency Medicine

## 2018-09-02 DIAGNOSIS — N200 Calculus of kidney: Secondary | ICD-10-CM | POA: Insufficient documentation

## 2018-09-02 DIAGNOSIS — F17210 Nicotine dependence, cigarettes, uncomplicated: Secondary | ICD-10-CM | POA: Insufficient documentation

## 2018-09-02 DIAGNOSIS — K8020 Calculus of gallbladder without cholecystitis without obstruction: Secondary | ICD-10-CM | POA: Insufficient documentation

## 2018-09-02 HISTORY — DX: Disorder of kidney and ureter, unspecified: N28.9

## 2018-09-02 HISTORY — DX: Other hereditary ataxias: G11.8

## 2018-09-02 LAB — URINALYSIS, COMPLETE (UACMP) WITH MICROSCOPIC
BILIRUBIN URINE: NEGATIVE
Glucose, UA: NEGATIVE mg/dL
KETONES UR: NEGATIVE mg/dL
LEUKOCYTES UA: NEGATIVE
Nitrite: NEGATIVE
PH: 7 (ref 5.0–8.0)
Protein, ur: NEGATIVE mg/dL
RBC / HPF: >50 RBC/hpf — ABNORMAL HIGH (ref 0–5)
Specific Gravity, Urine: 1.005 (ref 1.005–1.030)
Squamous Epithelial / HPF: NONE SEEN (ref 0–5)

## 2018-09-02 LAB — COMPREHENSIVE METABOLIC PANEL
ALT: 31 U/L (ref 0–44)
AST: 23 U/L (ref 15–41)
Albumin: 4.2 g/dL (ref 3.5–5.0)
Alkaline Phosphatase: 85 U/L (ref 38–126)
Anion gap: 4 — ABNORMAL LOW (ref 5–15)
BUN: 13 mg/dL (ref 6–20)
CHLORIDE: 110 mmol/L (ref 98–111)
CO2: 24 mmol/L (ref 22–32)
Calcium: 9.4 mg/dL (ref 8.9–10.3)
Creatinine, Ser: 1.11 mg/dL (ref 0.61–1.24)
GFR calc non Af Amer: >60 mL/min (ref >60)
Glucose, Bld: 115 mg/dL — ABNORMAL HIGH (ref 70–99)
Potassium: 4.2 mmol/L (ref 3.5–5.1)
Sodium: 138 mmol/L (ref 135–145)
Total Bilirubin: 0.7 mg/dL (ref 0.3–1.2)
Total Protein: 7.7 g/dL (ref 6.5–8.1)

## 2018-09-02 LAB — CBC
HCT: 51.6 % (ref 39.0–52.0)
Hemoglobin: 17.2 g/dL — ABNORMAL HIGH (ref 13.0–17.0)
MCH: 29.8 pg (ref 26.0–34.0)
MCHC: 33.3 g/dL (ref 30.0–36.0)
MCV: 89.4 fL (ref 80.0–100.0)
PLATELETS: 227 10*3/uL (ref 150–400)
RBC: 5.77 MIL/uL (ref 4.22–5.81)
RDW: 12.6 % (ref 11.5–15.5)
WBC: 7.2 10*3/uL (ref 4.0–10.5)
nRBC: 0 % (ref 0.0–0.2)

## 2018-09-02 LAB — LIPASE, BLOOD: Lipase: 30 U/L (ref 11–51)

## 2018-09-02 MED ORDER — ONDANSETRON HCL 4 MG/2ML IJ SOLN
4.0000 mg | Freq: Once | INTRAMUSCULAR | Status: AC
  Administered 2018-09-02: 4 mg via INTRAVENOUS

## 2018-09-02 MED ORDER — KETOROLAC TROMETHAMINE 60 MG/2ML IM SOLN
INTRAMUSCULAR | Status: AC
  Filled 2018-09-02: qty 2

## 2018-09-02 MED ORDER — HYDROMORPHONE HCL 1 MG/ML IJ SOLN
1.0000 mg | Freq: Once | INTRAMUSCULAR | Status: AC
  Administered 2018-09-02: 1 mg via INTRAVENOUS

## 2018-09-02 MED ORDER — KETOROLAC TROMETHAMINE 30 MG/ML IJ SOLN
30.0000 mg | Freq: Once | INTRAMUSCULAR | Status: AC
  Administered 2018-09-02: 30 mg via INTRAVENOUS

## 2018-09-02 MED ORDER — ONDANSETRON HCL 4 MG/2ML IJ SOLN
INTRAMUSCULAR | Status: AC
  Filled 2018-09-02: qty 2

## 2018-09-02 MED ORDER — HYDROMORPHONE HCL 1 MG/ML IJ SOLN
INTRAMUSCULAR | Status: AC
  Administered 2018-09-02: 1 mg via INTRAVENOUS
  Filled 2018-09-02: qty 1

## 2018-09-02 MED ORDER — OXYCODONE-ACETAMINOPHEN 7.5-325 MG PO TABS: 1.0000 | ORAL_TABLET | ORAL | 0 refills | Status: DC | PRN

## 2018-09-02 MED ORDER — TAMSULOSIN HCL 0.4 MG PO CAPS: 0.4000 mg | ORAL_CAPSULE | Freq: Every day | ORAL | 0 refills | Status: DC

## 2018-09-02 MED ORDER — ONDANSETRON 4 MG PO TBDP: 4.0000 mg | ORAL_TABLET | Freq: Three times a day (TID) | ORAL | 0 refills | Status: DC | PRN

## 2018-09-02 NOTE — ED Provider Notes (Signed)
[facility] Emergency Department Provider Note       Time seen: ----------------------------------------- 3:09 PM on 09/02/2018 -----------------------------------------   I have reviewed the triage vital signs and the nursing notes.  HISTORY   Chief Complaint Flank Pain    HPI Eric Webster is a 37 y.o. male with a history of kidney stones, ataxia who presents to the ED for groin pain and right flank pain.  Patient states he started feeling pain last night but today the pain worsened.  Patient describes a sharp and stabbing pain, has some nausea but denies any other complaints.  Past Medical History:  Diagnosis Date  . Episodic ataxia (HCC)   . Renal disorder    kidney stones    There are no active problems to display for this patient.   Past Surgical History:  Procedure Laterality Date  . HAND SURGERY Right     Allergies Patient has no known allergies.  Social History Social History   Tobacco Use  . Smoking status: Current Every Day Smoker    Packs/day: 1.50    Types: Cigarettes  . Smokeless tobacco: Never Used  Substance Use Topics  . Alcohol use: Yes    Comment: 1 pint liquor every weekend  . Drug use: No   Review of Systems Constitutional: Negative for fever. Cardiovascular: Negative for chest pain. Respiratory: Negative for shortness of breath. Gastrointestinal: Positive for flank pain, nausea Musculoskeletal: Negative for back pain. Skin: Negative for rash. Neurological: Negative for headaches, focal weakness or numbness.  All systems negative/normal/unremarkable except as stated in the HPI  ____________________________________________   PHYSICAL EXAM:  VITAL SIGNS: ED Triage Vitals  Enc Vitals Group     BP 09/02/18 1305 122/83     Pulse Rate 09/02/18 1305 69     Resp 09/02/18 1305 16     Temp 09/02/18 1305 97.9 F (36.6 C)     Temp Source 09/02/18 1305 Oral     SpO2 09/02/18 1305 97 %     Weight  09/02/18 1303 225 lb 1.4 oz (102.1 kg)     Height --      Head Circumference --      Peak Flow --      Pain Score 09/02/18 1303 7     Pain Loc --      Pain Edu? --      Excl. in GC? --    Constitutional: Alert and oriented. Well appearing and in no distress. Cardiovascular: Normal rate, regular rhythm. No murmurs, rubs, or gallops. Respiratory: Normal respiratory effort without tachypnea nor retractions. Breath sounds are clear and equal bilaterally. No wheezes/rales/rhonchi. Gastrointestinal: Mild right flank tenderness, normal bowel sounds Musculoskeletal: Nontender with normal range of motion in extremities. No lower extremity tenderness nor edema. Neurologic:  Normal speech and language. No gross focal neurologic deficits are appreciated.  Skin:  Skin is warm, dry and intact. No rash noted. Psychiatric: Mood and affect are normal. Speech and behavior are normal.  ____________________________________________  ED COURSE:  As part of my medical decision making, I reviewed the following data within the electronic MEDICAL RECORD NUMBER History obtained from family if available, nursing notes, old chart and ekg, as well as notes from prior ED visits. Patient presented for flank pain and likely renal colic, we will assess with labs and imaging as indicated at this time.   Procedures ____________________________________________   LABS (pertinent positives/negatives)  Labs Reviewed  CBC - Abnormal; Notable for the following components:  Result Value   Hemoglobin 17.2 (*)    All other components within normal limits  COMPREHENSIVE METABOLIC PANEL - Abnormal; Notable for the following components:   Glucose, Bld 115 (*)    Anion gap 4 (*)    All other components within normal limits  URINALYSIS, COMPLETE (UACMP) WITH MICROSCOPIC - Abnormal; Notable for the following components:   Color, Urine YELLOW (*)    APPearance CLEAR (*)    Hgb urine dipstick LARGE (*)    RBC / HPF >50 (*)     Bacteria, UA RARE (*)    All other components within normal limits  LIPASE, BLOOD    RADIOLOGY Images were viewed by me  CT renal protocol IMPRESSION: 1. 4.2 mm distal right ureteral obstructing stone located 8.5 cm proximal to the right ureteral vesicle junction is causing moderate right hydroureteronephrosis. 2. 4 mm right lower pole nonobstructing calculus. 3. 4 mm anterior right middle lobe peripheral based nodule (series 5, image 27 and series 6, image 49). No follow-up needed if patient is low-risk. Non-contrast chest CT can be considered in 12 months if patient is high-risk. This recommendation follows the consensus statement: Guidelines for Management of Incidental Pulmonary Nodules Detected on CT Images: From the Fleischner Society 2017; Radiology 2017; 284:228-243. 4. Noncalcified gallstone measuring up to 1.8 cm suspected.   ____________________________________________  DIFFERENTIAL DIAGNOSIS   Renal colic, UTI, pyelonephritis, muscle strain  FINAL ASSESSMENT AND PLAN  Renal colic   Plan: The patient had presented for right flank pain. Patient's labs are reassuring with the exception of hematuria. Patient's imaging did reveal a right-sided kidney stone with moderate hydronephrosis.  I did discuss his pulmonary nodule with him and advised he needs to follow-up for imaging in 1 year.  He is stable for outpatient follow-up.   Ulice Dash, MD   Note: This note was generated in part or whole with voice recognition software. Voice recognition is usually quite accurate but there are transcription errors that can and very often do occur. I apologize for any typographical errors that were not detected and corrected.     Emily Filbert, MD 09/02/18 [phone]

## 2018-09-02 NOTE — ED Triage Notes (Signed)
C/O pain to groin and right flank pain.  Started to feel pain last night, but today pain has worsened.  STates has history of kidney stones and this pain feels similar.

## 2018-09-02 NOTE — ED Notes (Signed)
AAOx3.  Skin warm and dry.  NAD 

## 2019-03-20 ENCOUNTER — Inpatient Hospital Stay
Admission: EM | Admit: 2019-03-20 | Discharge: 2019-03-23 | DRG: 372 | Disposition: A | Discharge status: Home / Self Care | Payer: Medicaid Other | Attending: Surgery | Admitting: Surgery

## 2019-03-20 ENCOUNTER — Encounter: Payer: Self-pay | Admitting: Emergency Medicine

## 2019-03-20 ENCOUNTER — Other Ambulatory Visit: Payer: Self-pay

## 2019-03-20 ENCOUNTER — Emergency Department: Payer: Medicaid Other

## 2019-03-20 DIAGNOSIS — N132 Hydronephrosis with renal and ureteral calculous obstruction: Secondary | ICD-10-CM | POA: Diagnosis present

## 2019-03-20 DIAGNOSIS — R1030 Lower abdominal pain, unspecified: Secondary | ICD-10-CM

## 2019-03-20 DIAGNOSIS — K3580 Unspecified acute appendicitis: Secondary | ICD-10-CM

## 2019-03-20 DIAGNOSIS — R918 Other nonspecific abnormal finding of lung field: Secondary | ICD-10-CM | POA: Diagnosis present

## 2019-03-20 DIAGNOSIS — K3532 Acute appendicitis with perforation and localized peritonitis, without abscess: Secondary | ICD-10-CM | POA: Diagnosis present

## 2019-03-20 DIAGNOSIS — Z20828 Contact with and (suspected) exposure to other viral communicable diseases: Secondary | ICD-10-CM | POA: Diagnosis present

## 2019-03-20 DIAGNOSIS — F17210 Nicotine dependence, cigarettes, uncomplicated: Secondary | ICD-10-CM | POA: Diagnosis present

## 2019-03-20 DIAGNOSIS — Z79899 Other long term (current) drug therapy: Secondary | ICD-10-CM | POA: Diagnosis not present

## 2019-03-20 DIAGNOSIS — K3533 Acute appendicitis with perforation and localized peritonitis, with abscess: Secondary | ICD-10-CM | POA: Diagnosis present

## 2019-03-20 DIAGNOSIS — N201 Calculus of ureter: Secondary | ICD-10-CM

## 2019-03-20 DIAGNOSIS — Z87442 Personal history of urinary calculi: Secondary | ICD-10-CM | POA: Diagnosis not present

## 2019-03-20 DIAGNOSIS — R1031 Right lower quadrant pain: Secondary | ICD-10-CM | POA: Diagnosis present

## 2019-03-20 LAB — BASIC METABOLIC PANEL
Anion gap: 9 (ref 5–15)
BUN: 14 mg/dL (ref 6–20)
CO2: 20 mmol/L — ABNORMAL LOW (ref 22–32)
Calcium: 9.2 mg/dL (ref 8.9–10.3)
Chloride: 108 mmol/L (ref 98–111)
Creatinine, Ser: 1.23 mg/dL (ref 0.61–1.24)
GFR calc Af Amer: >60 mL/min (ref >60)
GFR calc non Af Amer: >60 mL/min (ref >60)
Glucose, Bld: 107 mg/dL — ABNORMAL HIGH (ref 70–99)
Potassium: 3.8 mmol/L (ref 3.5–5.1)
Sodium: 137 mmol/L (ref 135–145)

## 2019-03-20 LAB — CBC
HCT: 45.3 % (ref 39.0–52.0)
Hemoglobin: 15 g/dL (ref 13.0–17.0)
MCH: 29.3 pg (ref 26.0–34.0)
MCHC: 33.1 g/dL (ref 30.0–36.0)
MCV: 88.5 fL (ref 80.0–100.0)
Platelets: 346 10*3/uL (ref 150–400)
RBC: 5.12 MIL/uL (ref 4.22–5.81)
RDW: 12.1 % (ref 11.5–15.5)
WBC: 15.6 10*3/uL — ABNORMAL HIGH (ref 4.0–10.5)
nRBC: 0 % (ref 0.0–0.2)

## 2019-03-20 LAB — URINALYSIS, COMPLETE (UACMP) WITH MICROSCOPIC
Bacteria, UA: NONE SEEN
Bilirubin Urine: NEGATIVE
Glucose, UA: NEGATIVE mg/dL
Ketones, ur: NEGATIVE mg/dL
Nitrite: NEGATIVE
Protein, ur: 30 mg/dL — AB
RBC / HPF: >50 RBC/hpf — ABNORMAL HIGH (ref 0–5)
Specific Gravity, Urine: 1.024 (ref 1.005–1.030)
pH: 5 (ref 5.0–8.0)

## 2019-03-20 LAB — SARS CORONAVIRUS 2 BY RT PCR (HOSPITAL ORDER, PERFORMED IN ~~LOC~~ HOSPITAL LAB): SARS Coronavirus 2: NEGATIVE

## 2019-03-20 MED ORDER — KETOROLAC TROMETHAMINE 30 MG/ML IJ SOLN
30.0000 mg | Freq: Four times a day (QID) | INTRAMUSCULAR | Status: DC
  Administered 2019-03-21 – 2019-03-23 (×10): 30 mg via INTRAVENOUS
  Filled 2019-03-20 (×10): qty 1

## 2019-03-20 MED ORDER — ONDANSETRON 4 MG PO TBDP: 4.0000 mg | ORAL_TABLET | Freq: Four times a day (QID) | ORAL | Status: DC | PRN

## 2019-03-20 MED ORDER — PIPERACILLIN-TAZOBACTAM 3.375 G IVPB
3.3750 g | Freq: Three times a day (TID) | INTRAVENOUS | Status: DC
  Administered 2019-03-21 – 2019-03-23 (×7): 3.375 g via INTRAVENOUS
  Filled 2019-03-20 (×8): qty 50

## 2019-03-20 MED ORDER — SODIUM CHLORIDE 0.9 % IV BOLUS
500.0000 mL | Freq: Once | INTRAVENOUS | Status: AC
  Administered 2019-03-20: 18:00:00 500 mL via INTRAVENOUS

## 2019-03-20 MED ORDER — POLYETHYLENE GLYCOL 3350 17 G PO PACK: 17.0000 g | PACK | Freq: Every day | ORAL | Status: DC | PRN

## 2019-03-20 MED ORDER — IOHEXOL 300 MG/ML  SOLN
100.0000 mL | Freq: Once | INTRAMUSCULAR | Status: AC | PRN
Start: 2019-03-20 — End: 2019-03-20
  Administered 2019-03-20: 18:00:00 100 mL via INTRAVENOUS

## 2019-03-20 MED ORDER — ONDANSETRON HCL 4 MG/2ML IJ SOLN: 4.0000 mg | Freq: Four times a day (QID) | INTRAMUSCULAR | Status: DC | PRN

## 2019-03-20 MED ORDER — SODIUM CHLORIDE 0.9 % IV BOLUS
500.0000 mL | Freq: Once | INTRAVENOUS | Status: AC
  Administered 2019-03-20: 19:00:00 500 mL via INTRAVENOUS

## 2019-03-20 MED ORDER — LACTATED RINGERS IV SOLN
125.0000 mL/h | INTRAVENOUS | Status: DC
  Administered 2019-03-20 – 2019-03-21 (×2): 125 mL/h via INTRAVENOUS

## 2019-03-20 MED ORDER — ONDANSETRON HCL 4 MG/2ML IJ SOLN
4.0000 mg | Freq: Once | INTRAMUSCULAR | Status: AC
  Administered 2019-03-20: 4 mg via INTRAVENOUS
  Filled 2019-03-20: qty 2

## 2019-03-20 MED ORDER — TAMSULOSIN HCL 0.4 MG PO CAPS
0.4000 mg | ORAL_CAPSULE | Freq: Every day | ORAL | Status: DC
  Administered 2019-03-20 – 2019-03-22 (×3): 0.4 mg via ORAL
  Filled 2019-03-20 (×3): qty 1

## 2019-03-20 MED ORDER — HYDROMORPHONE HCL 1 MG/ML IJ SOLN: 0.5000 mg | INTRAMUSCULAR | Status: DC | PRN

## 2019-03-20 MED ORDER — PIPERACILLIN-TAZOBACTAM 3.375 G IVPB 30 MIN
3.3750 g | Freq: Once | INTRAVENOUS | Status: AC
  Administered 2019-03-20: 19:00:00 3.375 g via INTRAVENOUS
  Filled 2019-03-20: qty 50

## 2019-03-20 MED ORDER — PANTOPRAZOLE SODIUM 40 MG IV SOLR
40.0000 mg | Freq: Every day | INTRAVENOUS | Status: DC
  Administered 2019-03-20 – 2019-03-22 (×3): 40 mg via INTRAVENOUS
  Filled 2019-03-20 (×3): qty 40

## 2019-03-20 MED ORDER — MORPHINE SULFATE (PF) 4 MG/ML IV SOLN
4.0000 mg | INTRAVENOUS | Status: DC | PRN
  Administered 2019-03-20 – 2019-03-21 (×3): 4 mg via INTRAVENOUS
  Filled 2019-03-20 (×3): qty 1

## 2019-03-20 NOTE — Progress Notes (Signed)
03/20/19 7:25 pm  Discussed patient with Dr. Roxan Hockey.  Presents with 5 day history of abdominal pain in the right lower quadrant.  Work up in ED shows WBC 15.6, Cr 1.23, with CT scan significant for ruptured appendicitis with abscess measuring about 5 cm, with inflammation of cecum, terminal ileum, and a segment of sigmoid colon.  Also shows a right ureteral stone causing moderate right hydroureteronephrosis.  There is no free air. I have independently viewed the images and agree with the findings. Vitals stable.  Will admit to surgical service.  NPO, IV fluid hydration, IV Zosyn, pain control.  Will plan for IR drainage of abscess tomorrow.  Will hold Lovenox/Heparin tonight for procedure tomorrow.  Dr. Roxan Hockey will contact Urology as well for evaluation of his kidney stone.  Full H&P to follow.  Henrene Dodge, MD

## 2019-03-20 NOTE — ED Triage Notes (Signed)
Pt reports hx of kidney stones and thinks he has another one. Pt reports pain to right side and radiates around to his groin area. Pt reports nausea as well.

## 2019-03-20 NOTE — ED Provider Notes (Signed)
[facility] Emergency Department Provider Note    First MD Initiated Contact with Patient 03/20/19 1705     (approximate)  I have reviewed the triage vital signs and the nursing notes.   HISTORY  Chief Complaint Flank Pain and Nausea    HPI Eric Webster is a 38 y.o. male history of kidney stones presents the ER with several days of progressively worsening right lower quadrant abdominal pain.  Denies any dysuria.  States he has noted some dark-colored urine.  States the pain is constant moderate to severe.  States it feels similar to previous kidney stones.  Has had some nausea but no vomiting.    Past Medical History:  Diagnosis Date   Episodic ataxia (HCC)    Renal disorder    kidney stones   No family history on file. Past Surgical History:  Procedure Laterality Date   HAND SURGERY Right    There are no active problems to display for this patient.     Prior to Admission medications   Medication Sig Start Date End Date Taking? Authorizing Provider  amoxicillin (AMOXIL) 500 MG capsule Take 1 capsule (500 mg total) by mouth 3 (three) times daily. 10/24/16   Joni ReiningSmith, Ronald K, PA-C  amoxicillin (AMOXIL) 500 MG tablet Take 1 tablet (500 mg total) by mouth 3 (three) times daily. 06/10/15   Triplett, Cari B, FNP  diazepam (VALIUM) 5 MG tablet Take 1 tablet (5 mg total) by mouth every 8 (eight) hours as needed for muscle spasms. 08/30/17   Emily FilbertWilliams, Jonathan E, MD  HYDROcodone-acetaminophen (NORCO/VICODIN) 5-325 MG per tablet Take 1 tablet by mouth every 4 (four) hours as needed. 06/10/15   Triplett, Rulon Eisenmengerari B, FNP  ibuprofen (ADVIL,MOTRIN) 600 MG tablet Take 1 tablet (600 mg total) by mouth every 8 (eight) hours as needed. 10/24/16   Joni ReiningSmith, Ronald K, PA-C  metoCLOPramide (REGLAN) 10 MG tablet Take 1 tablet (10 mg total) by mouth every 6 (six) hours as needed. 12/29/15   Myrna BlazerSchaevitz, David Matthew, MD  ondansetron (ZOFRAN ODT) 4 MG disintegrating tablet  Take 1 tablet (4 mg total) by mouth every 8 (eight) hours as needed for nausea or vomiting. 09/02/18   Emily FilbertWilliams, Jonathan E, MD  oxyCODONE-acetaminophen (PERCOCET) 7.5-325 MG tablet Take 1 tablet by mouth every 4 (four) hours as needed for severe pain. 09/02/18 09/02/19  Emily FilbertWilliams, Jonathan E, MD  tamsulosin (FLOMAX) 0.4 MG CAPS capsule Take 1 capsule (0.4 mg total) by mouth daily after breakfast. 09/02/18   Emily FilbertWilliams, Jonathan E, MD    Allergies Patient has no known allergies.    Social History Social History   Tobacco Use   Smoking status: Current Every Day Smoker    Packs/day: 1.50    Types: Cigarettes   Smokeless tobacco: Never Used  Substance Use Topics   Alcohol use: Yes    Comment: 1 pint liquor every weekend   Drug use: No    Review of Systems Patient denies headaches, rhinorrhea, blurry vision, numbness, shortness of breath, chest pain, edema, cough, abdominal pain, nausea, vomiting, diarrhea, dysuria, fevers, rashes or hallucinations unless otherwise stated above in HPI. ____________________________________________   PHYSICAL EXAM:  VITAL SIGNS: Vitals:   03/20/19 1506 03/20/19 1710  BP: 125/86   Pulse: 92   Resp: 18   Temp: 97.6 F (36.4 C)   SpO2: 97% 99%    Constitutional: Alert and oriented.  Eyes: Conjunctivae are normal.  Head: Atraumatic. Nose: No congestion/rhinnorhea. Mouth/Throat: Mucous membranes are moist.  Neck: No stridor. Painless ROM.  Cardiovascular: Normal rate, regular rhythm. Grossly normal heart sounds.  Good peripheral circulation. Respiratory: Normal respiratory effort.  No retractions. Lungs CTAB. Gastrointestinal: Soft with ttp in RLQ. No distention. No abdominal bruits. No CVA tenderness. Genitourinary: deferred Musculoskeletal: No lower extremity tenderness nor edema.  No joint effusions. Neurologic:  Normal speech and language. No gross focal neurologic deficits are appreciated. No facial droop Skin:  Skin is warm, dry and  intact. No rash noted. Psychiatric: Mood and affect are normal. Speech and behavior are normal.  ____________________________________________   LABS (all labs ordered are listed, but only abnormal results are displayed)  Results for orders placed or performed during the hospital encounter of 03/20/19 (from the past 24 hour(s))  Urinalysis, Complete w Microscopic     Status: Abnormal   Collection Time: 03/20/19  3:18 PM  Result Value Ref Range   Color, Urine AMBER (A) YELLOW   APPearance CLOUDY (A) CLEAR   Specific Gravity, Urine 1.024 1.005 - 1.030   pH 5.0 5.0 - 8.0   Glucose, UA NEGATIVE NEGATIVE mg/dL   Hgb urine dipstick LARGE (A) NEGATIVE   Bilirubin Urine NEGATIVE NEGATIVE   Ketones, ur NEGATIVE NEGATIVE mg/dL   Protein, ur 30 (A) NEGATIVE mg/dL   Nitrite NEGATIVE NEGATIVE   Leukocytes,Ua TRACE (A) NEGATIVE   RBC / HPF >50 (H) 0 - 5 RBC/hpf   WBC, UA 11-20 0 - 5 WBC/hpf   Bacteria, UA NONE SEEN NONE SEEN   Squamous Epithelial / LPF 0-5 0 - 5   Mucus PRESENT   Basic metabolic panel     Status: Abnormal   Collection Time: 03/20/19  3:19 PM  Result Value Ref Range   Sodium 137 135 - 145 mmol/L   Potassium 3.8 3.5 - 5.1 mmol/L   Chloride 108 98 - 111 mmol/L   CO2 20 (L) 22 - 32 mmol/L   Glucose, Bld 107 (H) 70 - 99 mg/dL   BUN 14 6 - 20 mg/dL   Creatinine, Ser 0.98 0.61 - 1.24 mg/dL   Calcium 9.2 8.9 - 11.9 mg/dL   GFR calc non Af Amer >60 >60 mL/min   GFR calc Af Amer >60 >60 mL/min   Anion gap 9 5 - 15  CBC     Status: Abnormal   Collection Time: 03/20/19  3:19 PM  Result Value Ref Range   WBC 15.6 (H) 4.0 - 10.5 K/uL   RBC 5.12 4.22 - 5.81 MIL/uL   Hemoglobin 15.0 13.0 - 17.0 g/dL   HCT 14.7 82.9 - 56.2 %   MCV 88.5 80.0 - 100.0 fL   MCH 29.3 26.0 - 34.0 pg   MCHC 33.1 30.0 - 36.0 g/dL   RDW 13.0 86.5 - 78.4 %   Platelets 346 150 - 400 K/uL   nRBC 0.0 0.0 - 0.2 %    ____________________________________________ ____________________________________________  RADIOLOGY  I personally reviewed all radiographic images ordered to evaluate for the above acute complaints and reviewed radiology reports and findings.  These findings were personally discussed with the patient.  Please see medical record for radiology report.  ____________________________________________   PROCEDURES  Procedure(s) performed:  .Critical Care Performed by: Willy Eddy, MD Authorized by: Willy Eddy, MD   Critical care provider statement:    Critical care time (minutes):  30   Critical care time was exclusive of:  Separately billable procedures and treating other patients   Critical care was necessary to treat or prevent imminent or  life-threatening deterioration of the following conditions:  Sepsis   Critical care was time spent personally by me on the following activities:  Development of treatment plan with patient or surrogate, discussions with consultants, evaluation of patient's response to treatment, examination of patient, obtaining history from patient or surrogate, ordering and performing treatments and interventions, ordering and review of laboratory studies, ordering and review of radiographic studies, pulse oximetry, re-evaluation of patient's condition and review of old charts      Critical Care performed: yes ____________________________________________   INITIAL IMPRESSION / ASSESSMENT AND PLAN / ED COURSE  Pertinent labs & imaging results that were available during my care of the patient were reviewed by me and considered in my medical decision making (see chart for details).   DDX: appendicitis, stone, diverticulitis, enteritis, sbo  FONZIE ASARE Webster is a 38 y.o. who presents to the ED with abd pain as described above.  Patient is AFVSS in ED. Exam as above. Given current presentation have considered the above differential.  The patient  will be placed on continuous pulse oximetry and telemetry for monitoring.  Laboratory evaluation will be sent to evaluate for the above complaints.      Clinical Course as of Mar 19 2346  Thu Mar 20, 2019  1829 CT imaging by my review appears concerning for acute appendicitis.   [PR]  1854 Discussed CT results and patient's presentation in consultation with Dr. Aleen Campi of general surgery.  Have started IV antibiotics.  Patient has been updated.   [PR]    Clinical Course User Index [PR] Willy Eddy, MD    The patient was evaluated in Emergency Department today for the symptoms described in the history of present illness. He/she was evaluated in the context of the global COVID-19 pandemic, which necessitated consideration that the patient might be at risk for infection with the SARS-CoV-2 virus that causes COVID-19. Institutional protocols and algorithms that pertain to the evaluation of patients at risk for COVID-19 are in a state of rapid change based on information released by regulatory bodies including the CDC and federal and state organizations. These policies and algorithms were followed during the patient's care in the ED.  As part of my medical decision making, I reviewed the following data within the electronic MEDICAL RECORD NUMBER Nursing notes reviewed and incorporated, Labs reviewed, notes from prior ED visits and [place] Controlled Substance Database   ____________________________________________   FINAL CLINICAL IMPRESSION(S) / ED DIAGNOSES  Final diagnoses:  Acute appendicitis, unspecified acute appendicitis type  Ureterolithiasis      NEW MEDICATIONS STARTED DURING THIS VISIT:  New Prescriptions   No medications on file     Note:  This document was prepared using Dragon voice recognition software and may include unintentional dictation errors.    Willy Eddy, MD 03/20/19 2348

## 2019-03-20 NOTE — ED Notes (Signed)
Pt reports pain. Morphine given as ordered.

## 2019-03-20 NOTE — Consult Note (Signed)
03/20/2019 8:00 PM   Eric RosenthalWilliam R Standen Webster 01/03/1981 [id number]  CC: Right sided abdominal pain  HPI: I was asked to see Eric Webster by Dr. Roxan Hockeyobinson in consultation for right-sided abdominal pain.  This visit was conducted virtually in the setting of the COVID-19 pandemic and pending coronavirus test on this patient.  He is a 38 year old male with a history of 4-5 spontaneously passed kidney stones, never requiring surgical intervention, who presents with a one-week history of right-sided abdominal and flank pain and nausea.  He reports some dark-colored urine as well.  CT scan with contrast in the ED tonight shows a ruptured appendix with 5 cm rim-enhancing abscess as well as a right 5 mm distal ureteral stone with upstream hydronephrosis.  He reports his pain is currently moderately controlled in the ED.  Urinalysis today benign appearing with no bacteria, nitrite negative, 11-20 WBCs, and greater than 50 RBCs.  He is being admitted to the general surgery service tonight for broad-spectrum antibiotics with plan for interventional radiology percutaneous abscess drain tomorrow morning.   PMH: Past Medical History:  Diagnosis Date  . Episodic ataxia (HCC)   . Renal disorder    kidney stones    Surgical History: Past Surgical History:  Procedure Laterality Date  . HAND SURGERY Right      Allergies: No Known Allergies  Family History: No family history on file.  Social History:  reports that he has been smoking cigarettes. He has been smoking about 1.50 packs per day. He has never used smokeless tobacco. He reports current alcohol use. He reports that he does not use drugs.  ROS: Please see flowsheet from today's date for complete review of systems.  Physical Exam: BP 135/90   Pulse 78   Temp 97.6 F (36.4 C) (Oral)   Resp 18   Ht 5\' 11"  (1.803 m)   Wt 97.5 kg   SpO2 99%   BMI 29.99 kg/m    Physical exam not conducted in setting of COVID-19 pandemic  Laboratory Data:  WBC 15.6 sCr 1.23  Urinalysis today nitrite negative, no bacteria, >50 RBCs, 11-20 WBCs  Pertinent Imaging: I have personally reviewed the CT A/P from today, suspected ruptured appendicitis with 5cm abscess, as well as 5mm right distal ureteral stone with moderate hydronephrosis.   Assessment & Plan:   In summary, the patient is a 38 year old male with 1 week of right-sided abdominal pain and nausea, and CT scan today showing ruptured appendicitis with 5 cm rim-enhancing abscess, as well as a 5 mm non-infected right distal ureteral stone.  He has a history of numerous spontaneously passed kidney stones that have never required surgical intervention.  He is being admitted overnight to the general surgery service with plan for percutaneous IR drainage of his abscess tomorrow.  We discussed various treatment options for urolithiasis including observation with or without medical expulsive therapy, shockwave lithotripsy (SWL), ureteroscopy and laser lithotripsy with stent placement, and percutaneous nephrolithotomy.  We discussed that management is based on stone size, location, density, patient co-morbidities, and patient preference.   Stones <345mm in size have a >80% spontaneous passage rate. Data surrounding the use of tamsulosin for medical expulsive therapy is controversial, but meta analyses suggests it is most efficacious for distal stones between 5-810mm in size. Possible side effects include dizziness/lightheadedness, and retrograde ejaculation.  SWL has a lower stone free rate in a single procedure, but also a lower complication rate compared to ureteroscopy and avoids a stent and associated stent related  symptoms. Possible complications include renal hematoma, steinstrasse, and need for additional treatment.  Ureteroscopy with laser lithotripsy and stent placement has a higher stone free rate than SWL in a single procedure, however increased complication rate including possible infection,  ureteral injury, bleeding, and stent related morbidity. Common stent related symptoms include dysuria, urgency/frequency, and flank pain.  We discussed the risks and benefits of all these options above, especially in the setting of ruptured appendix requiring percutaneous drainage.  He would like to pursue medical expulsive therapy, which is very reasonable.  Recommendations: 1. Flomax 0.4mg  nightly x 2 weeks 2. Strain all urine 3. Oral pain control, NSAIDs and narcotics if not contraindicated 4. Follow up in urology clinic in 2 weeks with KUB to confirm passage, arranged  Call if questions  Sondra Come, MD  [facility] [address] [place], [place] [address] [phone]

## 2019-03-20 NOTE — ED Notes (Signed)
Pt walked to room 7 unassisted. A&O x 4. Speaking to this RN with NAD or SOB. Reports pain 9/10 in lower back on the right side

## 2019-03-20 NOTE — ED Notes (Signed)
ED TO INPATIENT HANDOFF REPORT  ED Nurse Name and Phone #:  Toma CopierSherie [phone]  S Name/Age/Gender Eric Eric Webster 38 y.o. male Room/Bed: ED07A/ED07A  Code Status   Code Status: Full Code  Home/SNF/Other Home Patient oriented to: self, place, time and situation Is this baseline? Yes   Triage Complete: Triage complete  Chief Complaint Poss kidney stones  Triage Note Pt reports hx of kidney stones and thinks he has another one. Pt reports pain to right side and radiates around to his groin area. Pt reports nausea as well.    Allergies No Known Allergies  Level of Care/Admitting Diagnosis ED Disposition    ED Disposition Condition Comment   Admit  Hospital Area: [facility] [[id number]]  Level of Care: Med-Surg [16]  Covid Evaluation: N/A  Diagnosis: Ruptured appendicitis [[id number]][[id number]]  Admitting Physician: Henrene DodgePISCOYA, JOSE [[id number]]  Attending Physician: Henrene DodgePISCOYA, JOSE [[id number]]  Estimated length of stay: 3 - 4 days  Certification:: I certify this patient will need inpatient services for at least 2 midnights  PT Class (Do Not Modify): Inpatient [101]  PT Acc Code (Do Not Modify): Private [1]       B Medical/Surgery History Past Medical History:  Diagnosis Date  . Episodic ataxia (HCC)   . Renal disorder    kidney stones   Past Surgical History:  Procedure Laterality Date  . HAND SURGERY Right      A IV Location/Drains/Wounds Patient Lines/Drains/Airways Status   Active Line/Drains/Airways    Name:   Placement date:   Placement time:   Site:   Days:   Peripheral IV 03/20/19 Right Antecubital   03/20/19    1731    Antecubital   less than 1          Intake/Output Last 24 hours No intake or output data in the 24 hours ending 03/20/19 1940  Labs/Imaging Results for orders placed or performed during the hospital encounter of 03/20/19 (from the past 48 hour(s))  Urinalysis, Complete w Microscopic     Status: Abnormal   Collection Time: 03/20/19   3:18 PM  Result Value Ref Range   Color, Urine AMBER (A) YELLOW    Comment: BIOCHEMICALS MAY BE AFFECTED BY COLOR   APPearance CLOUDY (A) CLEAR   Specific Gravity, Urine 1.024 1.005 - 1.030   pH 5.0 5.0 - 8.0   Glucose, UA NEGATIVE NEGATIVE mg/dL   Hgb urine dipstick LARGE (A) NEGATIVE   Bilirubin Urine NEGATIVE NEGATIVE   Ketones, ur NEGATIVE NEGATIVE mg/dL   Protein, ur 30 (A) NEGATIVE mg/dL   Nitrite NEGATIVE NEGATIVE   Leukocytes,Ua TRACE (A) NEGATIVE   RBC / HPF >50 (H) 0 - 5 RBC/hpf   WBC, UA 11-20 0 - 5 WBC/hpf   Bacteria, UA NONE SEEN NONE SEEN   Squamous Epithelial / LPF 0-5 0 - 5   Mucus PRESENT     Comment: Performed at [facility], [address] [place], [place] [address]  Basic metabolic panel     Status: Abnormal   Collection Time: 03/20/19  3:19 PM  Result Value Ref Range   Sodium 137 135 - 145 mmol/L   Potassium 3.8 3.5 - 5.1 mmol/L   Chloride 108 98 - 111 mmol/L   CO2 20 (L) 22 - 32 mmol/L   Glucose, Bld 107 (H) 70 - 99 mg/dL   BUN 14 6 - 20 mg/dL   Creatinine, Ser 6.571.23 0.61 - 1.24 mg/dL   Calcium 9.2 8.9 - 10.3  mg/dL   GFR calc non Af Amer >60 >60 mL/min   GFR calc Af Amer >60 >60 mL/min   Anion gap 9 5 - 15    Comment: Performed at [facility], [address] [place], [place] [address]  CBC     Status: Abnormal   Collection Time: 03/20/19  3:19 PM  Result Value Ref Range   WBC 15.6 (H) 4.0 - 10.5 K/uL   RBC 5.12 4.22 - 5.81 MIL/uL   Hemoglobin 15.0 13.0 - 17.0 g/dL   HCT 75.6 43.3 - 29.5 %   MCV 88.5 80.0 - 100.0 fL   MCH 29.3 26.0 - 34.0 pg   MCHC 33.1 30.0 - 36.0 g/dL   RDW 18.8 41.6 - 60.6 %   Platelets 346 150 - 400 K/uL   nRBC 0.0 0.0 - 0.2 %    Comment: Performed at [facility], [address], [place], [place] [address]   Ct Abdomen Pelvis W Contrast  Result Date: 03/20/2019 CLINICAL DATA:  Right-side pain that radiates into the groin. History of kidney stones. EXAM: CT ABDOMEN AND PELVIS WITH CONTRAST  TECHNIQUE: Multidetector CT imaging of the abdomen and pelvis was performed using the standard protocol following bolus administration of intravenous contrast. CONTRAST:  OMNIPAQUE IOHEXOL 300 MG/ML  SOLN COMPARISON:  09/02/2018 FINDINGS: Lower chest: 5 mm posterior left lower lobe pulmonary nodule identified on image 3/series 4. 6 mm posterior right lung nodule seen on 04/04. Hepatobiliary: No suspicious focal abnormality within the liver parenchyma. 16 mm noncalcified stone identified in the gallbladder. No intrahepatic or extrahepatic biliary dilation. Pancreas: No focal mass lesion. No dilatation of the main duct. No intraparenchymal cyst. No peripancreatic edema. Spleen: No splenomegaly. No focal mass lesion. Adrenals/Urinary Tract: No adrenal nodule or mass. Left kidney and ureter unremarkable. Mild right hydroureteronephrosis evident with 4 x 5 x 5 mm stone in the distal right ureter, just distal to the iliac vessels. No bladder stones. Stomach/Bowel: Stomach is unremarkable. No gastric wall thickening. No evidence of outlet obstruction. Duodenum is normally positioned as is the ligament of Treitz. No small bowel wall thickening. No small bowel dilatation. Marked inflammatory process identified along the medial aspect of the cecum. Irregular rim enhancing fluid collection measures 4.7 x 4.3 x 3.1 cm (axial image 69/series 2) medial to the cecum with a finger of rim enhancing fluid tracking caudally down towards the tip of the appendix. Terminal ileum courses over the cranial aspect of this apparent abscess but does not appear to be the epicenter. The appendix cannot be identified on the current study, but was in this region on the previous CT of 09/02/2018. adjacent small bowel loops and sigmoid colon appear tethered to this abscess/inflammatory process and also demonstrate wall thickening, likely secondary. No evidence for bowel obstruction. Vascular/Lymphatic: No abdominal aortic aneurysm. No  abdominal aortic atherosclerotic calcification. There is no gastrohepatic or hepatoduodenal ligament lymphadenopathy. Borderline enlarged lymph nodes are seen in the ileocolic mesentery. No retroperitoneal lymphadenopathy. No pelvic sidewall lymphadenopathy. Reproductive: The prostate gland and seminal vesicles are unremarkable. Other: No substantial intraperitoneal free fluid. Musculoskeletal: No worrisome lytic or sclerotic osseous abnormality. IMPRESSION: 1. Extensive edema/inflammatory change in the ileocolic mesentery with a 5 cm irregular rim enhancing fluid collection medial to the cecum and compatible with abscess. A finger-like extension of rim enhancing fluid extends from the abscess down towards the tip of the cecum. Terminal ileum courses over the cranial margin of this abscess and adjacent small bowel loops and  sigmoid colon appear tethered into the inflammatory process, demonstrating wall thickening that is felt to be most likely secondary. Appendix is not identified on today's study but was in the region with inflammatory change/abscess on the prior exam and imaging features are felt to be most consistent with ruptured appendicitis and abscess formation. Mesenteric abscess from terminal ileitis/perforation considered much less likely and sigmoid colon etiology is considered unlikely. Meckel's diverticulitis, while a consideration, is considered much less likely. 2. Patient also noted to have a 4 x 5 x 5 mm stone in the distal right ureter, just beyond the iliac vessels. This causes mild right hydroureteronephrosis. 3. Small bilateral pulmonary nodules measuring up to 6 mm. Non-contrast chest CT at 3-6 months is recommended. If the nodules are stable at time of repeat CT, then future CT at 18-24 months (from today's scan) is considered optional for low-risk patients, but is recommended for high-risk patients. This recommendation follows the consensus statement: Guidelines for Management of Incidental  Pulmonary Nodules Detected on CT Images: From the Fleischner Society 2017; Radiology 2017; 284:228-243. Electronically Signed   By: Kennith Center M.D.   On: 03/20/2019 18:30    Pending Labs Unresulted Labs (From admission, onward)    Start     Ordered   03/21/19 0500  Basic metabolic panel  Tomorrow morning,   STAT     03/20/19 1924   03/21/19 0500  Magnesium  Tomorrow morning,   STAT     03/20/19 1924   03/21/19 0500  CBC WITH DIFFERENTIAL  Tomorrow morning,   STAT     03/20/19 1924   03/20/19 1917  HIV antibody (Routine Testing)  Once,   STAT     03/20/19 1924   03/20/19 1842  SARS Coronavirus 2 (CEPHEID - Performed in [facility] hospital lab), Hosp Order  (Asymptomatic Patients Labs)  Once,   STAT    Question:  Rule Out  Answer:  Yes   03/20/19 1841          Vitals/Pain Today's Vitals   03/20/19 1715 03/20/19 1730 03/20/19 1808 03/20/19 1814  BP:  128/90  125/83  Pulse: 82 80  76  Resp:    18  Temp:      TempSrc:      SpO2: 98% 95%  100%  Weight:      Height:      PainSc:   5      Isolation Precautions No active isolations  Medications Medications  morphine 4 MG/ML injection 4 mg (4 mg Intravenous Given 03/20/19 1731)  lactated ringers infusion (has no administration in time range)  ketorolac (TORADOL) 30 MG/ML injection 30 mg (has no administration in time range)  HYDROmorphone (DILAUDID) injection 0.5 mg (has no administration in time range)  polyethylene glycol (MIRALAX / GLYCOLAX) packet 17 g (has no administration in time range)  ondansetron (ZOFRAN-ODT) disintegrating tablet 4 mg (has no administration in time range)    Or  ondansetron (ZOFRAN) injection 4 mg (has no administration in time range)  pantoprazole (PROTONIX) injection 40 mg (has no administration in time range)  piperacillin-tazobactam (ZOSYN) IVPB 3.375 g (has no administration in time range)  ondansetron (ZOFRAN) injection 4 mg (4 mg Intravenous Given 03/20/19 1731)  sodium chloride 0.9 %  bolus 500 mL (0 mLs Intravenous Stopped 03/20/19 1847)  iohexol (OMNIPAQUE) 300 MG/ML solution 100 mL (100 mLs Intravenous Contrast Given 03/20/19 1754)  piperacillin-tazobactam (ZOSYN) IVPB 3.375 g (0 g Intravenous Stopped 03/20/19 1928)  sodium chloride 0.9 %  bolus 500 mL (0 mLs Intravenous Stopped 03/20/19 1930)    Mobility walks Low fall risk   Focused Assessments n/a   R Recommendations: See Admitting Provider Note  Report given to:   Additional Notes: n/a

## 2019-03-21 ENCOUNTER — Inpatient Hospital Stay: Payer: Medicaid Other

## 2019-03-21 DIAGNOSIS — K3532 Acute appendicitis with perforation and localized peritonitis, without abscess: Secondary | ICD-10-CM

## 2019-03-21 LAB — BASIC METABOLIC PANEL
Anion gap: 10 (ref 5–15)
BUN: 15 mg/dL (ref 6–20)
CO2: 19 mmol/L — ABNORMAL LOW (ref 22–32)
Calcium: 8.9 mg/dL (ref 8.9–10.3)
Chloride: 109 mmol/L (ref 98–111)
Creatinine, Ser: 1.28 mg/dL — ABNORMAL HIGH (ref 0.61–1.24)
GFR calc Af Amer: >60 mL/min (ref >60)
GFR calc non Af Amer: >60 mL/min (ref >60)
Glucose, Bld: 96 mg/dL (ref 70–99)
Potassium: 3.6 mmol/L (ref 3.5–5.1)
Sodium: 138 mmol/L (ref 135–145)

## 2019-03-21 LAB — CBC WITH DIFFERENTIAL/PLATELET
Abs Immature Granulocytes: 0.04 10*3/uL (ref 0.00–0.07)
Basophils Absolute: 0.1 10*3/uL (ref 0.0–0.1)
Basophils Relative: 1 %
Eosinophils Absolute: 0.2 10*3/uL (ref 0.0–0.5)
Eosinophils Relative: 1 %
HCT: 42.3 % (ref 39.0–52.0)
Hemoglobin: 13.7 g/dL (ref 13.0–17.0)
Immature Granulocytes: 0 %
Lymphocytes Relative: 11 %
Lymphs Abs: 1.7 10*3/uL (ref 0.7–4.0)
MCH: 29 pg (ref 26.0–34.0)
MCHC: 32.4 g/dL (ref 30.0–36.0)
MCV: 89.6 fL (ref 80.0–100.0)
Monocytes Absolute: 1.5 10*3/uL — ABNORMAL HIGH (ref 0.1–1.0)
Monocytes Relative: 10 %
Neutro Abs: 11.6 10*3/uL — ABNORMAL HIGH (ref 1.7–7.7)
Neutrophils Relative %: 77 %
Platelets: 320 10*3/uL (ref 150–400)
RBC: 4.72 MIL/uL (ref 4.22–5.81)
RDW: 12.2 % (ref 11.5–15.5)
WBC: 15.1 10*3/uL — ABNORMAL HIGH (ref 4.0–10.5)
nRBC: 0 % (ref 0.0–0.2)

## 2019-03-21 LAB — PROTIME-INR
INR: 1.1 (ref 0.8–1.2)
Prothrombin Time: 13.6 seconds (ref 11.4–15.2)

## 2019-03-21 LAB — APTT: aPTT: 36 seconds (ref 24–36)

## 2019-03-21 LAB — MAGNESIUM: Magnesium: 2.1 mg/dL (ref 1.7–2.4)

## 2019-03-21 MED ORDER — MIDAZOLAM HCL 5 MG/5ML IJ SOLN
INTRAMUSCULAR | Status: AC
  Filled 2019-03-21: qty 5

## 2019-03-21 MED ORDER — FENTANYL CITRATE (PF) 100 MCG/2ML IJ SOLN
INTRAMUSCULAR | Status: AC | PRN
  Administered 2019-03-21 (×3): 25 ug via INTRAVENOUS
  Administered 2019-03-21: 50 ug via INTRAVENOUS

## 2019-03-21 MED ORDER — SODIUM CHLORIDE 0.9% FLUSH
5.0000 mL | Freq: Three times a day (TID) | INTRAVENOUS | Status: DC
  Administered 2019-03-21 – 2019-03-23 (×6): 5 mL

## 2019-03-21 MED ORDER — MIDAZOLAM HCL 5 MG/5ML IJ SOLN
INTRAMUSCULAR | Status: AC | PRN
  Administered 2019-03-21: 1 mg via INTRAVENOUS
  Administered 2019-03-21: 0.5 mg via INTRAVENOUS
  Administered 2019-03-21 (×2): 1 mg via INTRAVENOUS

## 2019-03-21 MED ORDER — FENTANYL CITRATE (PF) 100 MCG/2ML IJ SOLN
INTRAMUSCULAR | Status: AC
  Filled 2019-03-21: qty 4

## 2019-03-21 NOTE — Procedures (Signed)
Pre procedural Dx: Rupture appendicitis Post procedural Dx: Same  Technically successful CT guided placed of a 10 Fr drainage catheter placement into the peri-appendiceal abscess yielding 40 cc of purulent, foul smelling fluid.    All aspirated samples sent to the laboratory for analysis.    EBL: Minimal  Complications: None immediate  Katherina Right, MD Pager #: [phone]

## 2019-03-21 NOTE — Progress Notes (Signed)
Patient's spouse Stormy Fabian [phone] called for update. Update given.   Madie Reno, RN

## 2019-03-21 NOTE — Progress Notes (Signed)
Patient tolerated abd. Drainage tube insertion well.  No distress.  Report called to Mist, RN on 2A.  Gauze dressing covered with tegaderm applied.  Drainage tube to gravity bag.  Transferred to room and handoff done.

## 2019-03-21 NOTE — Progress Notes (Signed)
Wife called for update on patient. Update given.   Madie Reno, RN

## 2019-03-21 NOTE — H&P (Signed)
[facility] SURGICAL HISTORY & PHYSICAL (cpt [phone])  HISTORY OF PRESENT ILLNESS (HPI):  38 y.o. male presented to [facility] ED yesterday (03/20/2019) for abdominal pain. Patient reports several days of worsening sharp RLQ. The pain has been constant and sharp in nature. He endorses associated nausea and emesis with the abdominal pain. No complaints of fever, chills, congestion, cough, CP, SOB, or bladder/bowel changes. No history of similar pain. No previous abdominal surgeries. Work up in the ED was concerning leukocytosis to 15.6K as well as ruptured appendicitis with 5 cm abscess. Additionally, he was found to have a right ureteral stone with right hydronephrosis and a sCr of 1.23. Urology was consulted and recommended straining urine and Flomax.   This morning, he is feeling better with improvement in his RLQ. No fever, chills, nausea, or emesis. Has been NPO. Plans for IR drain placement this afternoon.   General surgery is consulted by emergency medicine physician Dr Willy EddyPatrick Robinson, MD for evaluation and management of ruptured acute appendicitis with 5 cm abscess.   PAST MEDICAL HISTORY (PMH):  Past Medical History:  Diagnosis Date  . Episodic ataxia (HCC)   . Renal disorder    kidney stones    Reviewed. Otherwise negative.   PAST SURGICAL HISTORY (PSH):  Past Surgical History:  Procedure Laterality Date  . HAND SURGERY Right     Reviewed. Otherwise negative.   MEDICATIONS:  Prior to Admission medications   Medication Sig Start Date End Date Taking? Authorizing Provider  acetaZOLAMIDE (DIAMOX) 250 MG tablet Take 250 mg by mouth 2 (two) times a day. 02/07/19  Yes [provider]     ALLERGIES:  No Known Allergies   SOCIAL HISTORY:  Social History   Socioeconomic History  . Marital status: Married    Spouse name: Not on file  . Number of children: Not on file  . Years of education: Not on file  . Highest education level: Not on file  Occupational  History  . Not on file  Social Needs  . Financial resource strain: Not on file  . Food insecurity:    Worry: Not on file    Inability: Not on file  . Transportation needs:    Medical: Not on file    Non-medical: Not on file  Tobacco Use  . Smoking status: Current Every Day Smoker    Packs/day: 1.50    Types: Cigarettes  . Smokeless tobacco: Never Used  Substance and Sexual Activity  . Alcohol use: Yes    Comment: 1 pint liquor every weekend  . Drug use: No  . Sexual activity: Not on file  Lifestyle  . Physical activity:    Days per week: Not on file    Minutes per session: Not on file  . Stress: Not on file  Relationships  . Social connections:    Talks on phone: Not on file    Gets together: Not on file    Attends religious service: Not on file    Active member of club or organization: Not on file    Attends meetings of clubs or organizations: Not on file    Relationship status: Not on file  . Intimate partner violence:    Fear of current or ex partner: Not on file    Emotionally abused: Not on file    Physically abused: Not on file    Forced sexual activity: Not on file  Other Topics Concern  . Not on file  Social History Narrative  .  Not on file     FAMILY HISTORY:  History reviewed. No pertinent family history.  Otherwise negative.   REVIEW OF SYSTEMS:  Review of Systems  Constitutional: Negative for chills and fever.  HENT: Negative for congestion and sore throat.   Respiratory: Negative for cough and shortness of breath.   Cardiovascular: Negative for chest pain and palpitations.  Gastrointestinal: Positive for abdominal pain, nausea and vomiting. Negative for constipation and diarrhea.  Genitourinary: Negative for dysuria and urgency.  Neurological: Negative for dizziness and headaches.  All other systems reviewed and are negative.   VITAL SIGNS:  Temp:  [97.6 F (36.4 C)-98.5 F (36.9 C)] 98.5 F (36.9 C) (05/22 0527) Pulse Rate:  [69-92] 70  (05/22 0527) Resp:  [18-20] 18 (05/22 0527) BP: (100-135)/(67-90) 100/67 (05/22 0527) SpO2:  [94 %-100 %] 94 % (05/22 0527) Weight:  [97 kg-97.5 kg] 97 kg (05/21 2050)     Height: 6' (182.9 cm) Weight: 97 kg BMI (Calculated): 28.99   PHYSICAL EXAM:  Physical Exam Vitals signs and nursing note reviewed.  Constitutional:      General: He is not in acute distress.    Appearance: Normal appearance. He is not ill-appearing.  HENT:     Head: Normocephalic and atraumatic.  Eyes:     General: No scleral icterus.    Conjunctiva/sclera: Conjunctivae normal.  Cardiovascular:     Rate and Rhythm: Normal rate and regular rhythm.     Pulses: Normal pulses.     Heart sounds: No murmur. No friction rub. No gallop.   Pulmonary:     Effort: Pulmonary effort is normal. No respiratory distress.     Breath sounds: Normal breath sounds. No wheezing or rhonchi.  Abdominal:     General: Abdomen is flat. There is no distension.     Palpations: Abdomen is soft.     Tenderness: There is abdominal tenderness in the right lower quadrant. There is no guarding or rebound. Negative signs include McBurney's sign.  Genitourinary:    Comments: Deferred Skin:    General: Skin is warm and dry.     Coloration: Skin is not pale.     Findings: No erythema.  Neurological:     General: No focal deficit present.     Mental Status: He is alert. He is disoriented.  Psychiatric:        Mood and Affect: Mood normal.        Behavior: Behavior normal.     INTAKE/OUTPUT:  This shift: No intake/output data recorded.  Last 2 shifts: @  Labs:  CBC Latest Ref Rng & Units 03/21/2019 03/20/2019 09/02/2018  WBC 4.0 - 10.5 K/uL 15.1(H) 15.6(H) 7.2  Hemoglobin 13.0 - 17.0 g/dL 40.9 81.1 17.2(H)  Hematocrit 39.0 - 52.0 % 42.3 45.3 51.6  Platelets 150 - 400 K/uL 320 346 227   CMP Latest Ref Rng & Units 03/21/2019 03/20/2019 09/02/2018  Glucose 70 - 99 mg/dL 96 914(N) 829(F)  BUN 6 - 20 mg/dL Creatinine  0.61 - 1.24 mg/dL 6.21(H) 0.86 5.78  Sodium 135 - 145 mmol/L 138 137 138  Potassium 3.5 - 5.1 mmol/L 3.6 3.8 4.2  Chloride 98 - 111 mmol/L 109 108 110  CO2 22 - 32 mmol/L 19(L) 20(L) 24  Calcium 8.9 - 10.3 mg/dL 8.9 9.2 9.4  Total Protein 6.5 - 8.1 g/dL - - 7.7  Total Bilirubin 0.3 - 1.2 mg/dL - - 0.7  Alkaline Phos 38 - 126 U/L - - 85  AST 15 - 41 U/L - - 23  ALT 0 - 44 U/L - - 31    Imaging studies:   CT Abdomen/Pelvis (03/20/2019) personally reviewed and radiologist report reviewed:  IMPRESSION: 1. Extensive edema/inflammatory change in the ileocolic mesentery with a 5 cm irregular rim enhancing fluid collection medial to the cecum and compatible with abscess. A finger-like extension of rim enhancing fluid extends from the abscess down towards the tip of the cecum. Terminal ileum courses over the cranial margin of this abscess and adjacent small bowel loops and sigmoid colon appear tethered into the inflammatory process, demonstrating wall thickening that is felt to be most likely secondary. Appendix is not identified on today's study but was in the region with inflammatory change/abscess on the prior exam and imaging features are felt to be most consistent with ruptured appendicitis and abscess formation. Mesenteric abscess from terminal ileitis/perforation considered much less likely and sigmoid colon etiology is considered unlikely. Meckel's diverticulitis, while a consideration, is considered much less likely. 2. Patient also noted to have a 4 x 5 x 5 mm stone in the distal right ureter, just beyond the iliac vessels. This causes mild right hydroureteronephrosis. 3. Small bilateral pulmonary nodules measuring up to 6 mm. Non-contrast chest CT at 3-6 months is recommended. If the nodules are stable at time of repeat CT, then future CT at 18-24 months (from today's scan) is considered optional for low-risk patients, but is recommended for high-risk patients. This  recommendation follows the consensus statement: Guidelines for Management of Incidental Pulmonary Nodules Detected on CT Images: From the Fleischner Society 2017; Radiology 2017; 284:228-243.    Assessment/Plan: (ICD-10's: K35.32) 38 y.o. male with RLQ abdominal pain and leukocytosis likely attributable to ruptured appendicitis with 5 cm abscess, complicated right ureteral stone.   - NPO +IVF + IV Abx (Zosyn --> Day 2)  - Pain control prn; antiemetics prn  - Monitor leukocytosis; renal function  - Monitor abdominal examination  - Plan for IR drainage of abscess today  - No emergent surgical intervention indicated currently; patient understands if he clinically deteriorates this may occur.   - Will benefit from interval appendectomy as outpatient  - Appreciate urology input; recommending 0.4mg  Flomax QHS x2 weeks, strain urine, follow up in 2 weeks  - Medical management of comorbidities   - Mobilization encouraged  - Holding DVT prophylaxis for IR procedure; will start after  All of the above findings and recommendations were discussed with the patient, and all of his questions were answered to his expressed satisfaction.  -- Lynden Oxford, PA-C [facility] 03/21/2019, 8:07 AM [phone] M-F: 7am - 4pm

## 2019-03-22 LAB — BASIC METABOLIC PANEL
Anion gap: 11 (ref 5–15)
BUN: 21 mg/dL — ABNORMAL HIGH (ref 6–20)
CO2: 18 mmol/L — ABNORMAL LOW (ref 22–32)
Calcium: 8.7 mg/dL — ABNORMAL LOW (ref 8.9–10.3)
Chloride: 111 mmol/L (ref 98–111)
Creatinine, Ser: 1.15 mg/dL (ref 0.61–1.24)
GFR calc Af Amer: >60 mL/min (ref >60)
GFR calc non Af Amer: >60 mL/min (ref >60)
Glucose, Bld: 89 mg/dL (ref 70–99)
Potassium: 3.5 mmol/L (ref 3.5–5.1)
Sodium: 140 mmol/L (ref 135–145)

## 2019-03-22 LAB — CBC
HCT: 40.9 % (ref 39.0–52.0)
Hemoglobin: 13.2 g/dL (ref 13.0–17.0)
MCH: 28.8 pg (ref 26.0–34.0)
MCHC: 32.3 g/dL (ref 30.0–36.0)
MCV: 89.3 fL (ref 80.0–100.0)
Platelets: 293 10*3/uL (ref 150–400)
RBC: 4.58 MIL/uL (ref 4.22–5.81)
RDW: 12 % (ref 11.5–15.5)
WBC: 10.5 10*3/uL (ref 4.0–10.5)
nRBC: 0 % (ref 0.0–0.2)

## 2019-03-22 LAB — HIV ANTIBODY (ROUTINE TESTING W REFLEX): HIV Screen 4th Generation wRfx: NONREACTIVE

## 2019-03-22 MED ORDER — ALBUMIN HUMAN 25 % IV SOLN
25.0000 g | Freq: Once | INTRAVENOUS | Status: AC
  Administered 2019-03-22: 25 g via INTRAVENOUS
  Filled 2019-03-22: qty 100

## 2019-03-22 MED ORDER — LACTATED RINGERS IV BOLUS
1000.0000 mL | Freq: Once | INTRAVENOUS | Status: AC
  Administered 2019-03-22: 1000 mL via INTRAVENOUS

## 2019-03-22 MED ORDER — SODIUM CHLORIDE 0.9 % IV SOLN
INTRAVENOUS | Status: DC | PRN
  Administered 2019-03-22 – 2019-03-23 (×2): 250 mL via INTRAVENOUS

## 2019-03-22 MED ORDER — OXYCODONE-ACETAMINOPHEN 5-325 MG PO TABS: 1.0000 | ORAL_TABLET | ORAL | Status: DC | PRN

## 2019-03-22 NOTE — Progress Notes (Signed)
CC: Appendicitis with perforation,  phlegmon and abscess  Subjective: Perc drained yesterday, feeling better, on clears . Wbc going down  Objective: Vital signs in last 24 hours: Temp:  [97.8 F (36.6 C)-98.9 F (37.2 C)] 98.6 F (37 C) (05/23 0344) Pulse Rate:  [73-93] 73 (05/23 0344) Resp:  [16-23] 20 (05/23 0344) BP: (116-129)/(72-79) 117/76 (05/23 0344) SpO2:  [94 %-98 %] 96 % (05/23 0344) Last BM Date: 03/20/19  Intake/Output from previous day: 05/22 0701 - 05/23 0700 In: 635.5 [I.V.:553.2; IV Piggyback:82.3] Out: 505 [Urine:475; Drains:30] Intake/Output this shift: Total I/O In: -  Out: 330 [Urine:300; Drains:30]  Physical exam:  NAD, alert and awake Abd: soft, mild diffuse tenderness, no peritonitis . Drain in place Ext: well perfused and no edema   Lab Results: CBC  Recent Labs    03/21/19 0529 03/22/19 0620  WBC 15.1* 10.5  HGB 13.7 13.2  HCT 42.3 40.9  PLT 320 293   BMET Recent Labs    03/21/19 0529 03/22/19 0620  NA 138 140  K 3.6 3.5  CL 109 111  CO2 19* 18*  GLUCOSE 96 89  BUN 15 21*  CREATININE 1.28* 1.15  CALCIUM 8.9 8.7*   PT/INR Recent Labs    03/21/19 0821  LABPROT 13.6  INR 1.1   ABG No results for input(s): PHART, HCO3 in the last 72 hours.  Invalid input(s): PCO2, PO2  Studies/Results: Ct Abdomen Pelvis W Contrast  Result Date: 03/20/2019 CLINICAL DATA:  Right-side pain that radiates into the groin. History of kidney stones. EXAM: CT ABDOMEN AND PELVIS WITH CONTRAST TECHNIQUE: Multidetector CT imaging of the abdomen and pelvis was performed using the standard protocol following bolus administration of intravenous contrast. CONTRAST:  100mL OMNIPAQUE IOHEXOL 300 MG/ML  SOLN COMPARISON:  09/02/2018 FINDINGS: Lower chest: 5 mm posterior left lower lobe pulmonary nodule identified on image 3/series 4. 6 mm posterior right lung nodule seen on 04/04. Hepatobiliary: No suspicious focal abnormality within the liver parenchyma. 16  mm noncalcified stone identified in the gallbladder. No intrahepatic or extrahepatic biliary dilation. Pancreas: No focal mass lesion. No dilatation of the main duct. No intraparenchymal cyst. No peripancreatic edema. Spleen: No splenomegaly. No focal mass lesion. Adrenals/Urinary Tract: No adrenal nodule or mass. Left kidney and ureter unremarkable. Mild right hydroureteronephrosis evident with 4 x 5 x 5 mm stone in the distal right ureter, just distal to the iliac vessels. No bladder stones. Stomach/Bowel: Stomach is unremarkable. No gastric wall thickening. No evidence of outlet obstruction. Duodenum is normally positioned as is the ligament of Treitz. No small bowel wall thickening. No small bowel dilatation. Marked inflammatory process identified along the medial aspect of the cecum. Irregular rim enhancing fluid collection measures 4.7 x 4.3 x 3.1 cm (axial image 69/series 2) medial to the cecum with a finger of rim enhancing fluid tracking caudally down towards the tip of the appendix. Terminal ileum courses over the cranial aspect of this apparent abscess but does not appear to be the epicenter. The appendix cannot be identified on the current study, but was in this region on the previous CT of 09/02/2018. adjacent small bowel loops and sigmoid colon appear tethered to this abscess/inflammatory process and also demonstrate wall thickening, likely secondary. No evidence for bowel obstruction. Vascular/Lymphatic: No abdominal aortic aneurysm. No abdominal aortic atherosclerotic calcification. There is no gastrohepatic or hepatoduodenal ligament lymphadenopathy. Borderline enlarged lymph nodes are seen in the ileocolic mesentery. No retroperitoneal lymphadenopathy. No pelvic sidewall lymphadenopathy. Reproductive: The prostate gland and  seminal vesicles are unremarkable. Other: No substantial intraperitoneal free fluid. Musculoskeletal: No worrisome lytic or sclerotic osseous abnormality. IMPRESSION: 1.  Extensive edema/inflammatory change in the ileocolic mesentery with a 5 cm irregular rim enhancing fluid collection medial to the cecum and compatible with abscess. A finger-like extension of rim enhancing fluid extends from the abscess down towards the tip of the cecum. Terminal ileum courses over the cranial margin of this abscess and adjacent small bowel loops and sigmoid colon appear tethered into the inflammatory process, demonstrating wall thickening that is felt to be most likely secondary. Appendix is not identified on today's study but was in the region with inflammatory change/abscess on the prior exam and imaging features are felt to be most consistent with ruptured appendicitis and abscess formation. Mesenteric abscess from terminal ileitis/perforation considered much less likely and sigmoid colon etiology is considered unlikely. Meckel's diverticulitis, while a consideration, is considered much less likely. 2. Patient also noted to have a 4 x 5 x 5 mm stone in the distal right ureter, just beyond the iliac vessels. This causes mild right hydroureteronephrosis. 3. Small bilateral pulmonary nodules measuring up to 6 mm. Non-contrast chest CT at 3-6 months is recommended. If the nodules are stable at time of repeat CT, then future CT at 18-24 months (from today's scan) is considered optional for low-risk patients, but is recommended for high-risk patients. This recommendation follows the consensus statement: Guidelines for Management of Incidental Pulmonary Nodules Detected on CT Images: From the Fleischner Society 2017; Radiology 2017; 284:228-243. Electronically Signed   By: Kennith Center M.D.   On: 03/20/2019 18:30   Ct Image Guided Drainage By Percutaneous Catheter  Result Date: 03/21/2019 INDICATION: History of ruptured appendicitis. Please perform percutaneous drainage catheter placement for infection source control purposes. EXAM: CT IMAGE GUIDED DRAINAGE BY PERCUTANEOUS CATHETER COMPARISON:  CT  abdomen and pelvis - 03/20/2019 MEDICATIONS: The patient is currently admitted to the hospital and receiving intravenous antibiotics. The antibiotics were administered within an appropriate time frame prior to the initiation of the procedure. ANESTHESIA/SEDATION: Moderate (conscious) sedation was employed during this procedure. A total of Versed 2.5 mg and Fentanyl 125 mcg was administered intravenously. Moderate Sedation Time: 20 minutes. The patient's level of consciousness and vital signs were monitored continuously by radiology nursing throughout the procedure under my direct supervision. CONTRAST:  None COMPLICATIONS: None immediate. PROCEDURE: Informed written consent was obtained from the patient after a discussion of the risks, benefits and alternatives to treatment. The patient was placed supine on the CT gantry and a pre procedural CT was performed re-demonstrating the known abscess/fluid collection within the right lower quadrant with dominant collection measuring approximately 3.9 x 3.7 cm (image 12, series 2). The procedure was planned. A timeout was performed prior to the initiation of the procedure. The skin overlying the ventral aspect the right lower abdomen/pelvis was prepped and draped in the usual sterile fashion. The overlying soft tissues were anesthetized with 1% lidocaine with epinephrine. Appropriate trajectory was planned with the use of a 22 gauge spinal needle. An 18 gauge trocar needle was advanced into the abscess/fluid collection and a short Amplatz super stiff wire was coiled within the collection. CT imaging demonstrated concern for potential transgression of an overlying loop bowel initial access needle was removed and the collection was recannulated from a slightly lower access site with slight caudal-cranial trajectory. Again, a short Amplatz wire was coiled within the collection. Appropriate positioning was confirmed with a limited CT scan. The tract was serially dilated  allowing  placement of a 10 [place] all-purpose drainage catheter. Appropriate positioning was confirmed with a limited postprocedural CT scan. Approximately 40 ml of purulent, foul smelling fluid was aspirated. The tube was connected to a drainage bag and sutured in place. A dressing was placed. The patient tolerated the procedure well without immediate post procedural complication. IMPRESSION: Successful CT guided placement of a 10 French all purpose drain catheter into the right lower quadrant periappendiceal abscess with aspiration of 40 mL of purulent fluid. Samples were sent to the laboratory as requested by the ordering clinical team. Electronically Signed   By: Simonne Come M.D.   On: 03/21/2019 15:03    Anti-infectives: Anti-infectives (From admission, onward)   Start     Dose/Rate Route Frequency Ordered Stop   03/21/19 0100  piperacillin-tazobactam (ZOSYN) IVPB 3.375 g     3.375 g 12.5 mL/hr over 240 Minutes Intravenous Every 8 hours 03/20/19 1924     03/20/19 1845  piperacillin-tazobactam (ZOSYN) IVPB 3.375 g     3.375 g 100 mL/hr over 30 Minutes Intravenous  Once 03/20/19 1840 03/20/19 1928      Assessment/Plan:  Appendiceal abscess s/p perc drain continue a/bs advance diet May DC in am if he continues to improve  D. Kamen Hanken MD, Carilyn Goodpasture  03/22/2019

## 2019-03-22 NOTE — Progress Notes (Signed)
Pt instructed on hoew to flush drain and empty drain, verbalized understanding.

## 2019-03-23 LAB — AEROBIC/ANAEROBIC CULTURE W GRAM STAIN (SURGICAL/DEEP WOUND)

## 2019-03-23 LAB — BASIC METABOLIC PANEL
Anion gap: 8 (ref 5–15)
BUN: 15 mg/dL (ref 6–20)
CO2: 23 mmol/L (ref 22–32)
Calcium: 8.6 mg/dL — ABNORMAL LOW (ref 8.9–10.3)
Chloride: 109 mmol/L (ref 98–111)
Creatinine, Ser: 0.99 mg/dL (ref 0.61–1.24)
GFR calc Af Amer: >60 mL/min (ref >60)
GFR calc non Af Amer: >60 mL/min (ref >60)
Glucose, Bld: 97 mg/dL (ref 70–99)
Potassium: 3.4 mmol/L — ABNORMAL LOW (ref 3.5–5.1)
Sodium: 140 mmol/L (ref 135–145)

## 2019-03-23 MED ORDER — METRONIDAZOLE 500 MG PO TABS: 500.0000 mg | ORAL_TABLET | Freq: Three times a day (TID) | ORAL | 0 refills | Status: AC

## 2019-03-23 MED ORDER — OXYCODONE-ACETAMINOPHEN 5-325 MG PO TABS: 1.0000 | ORAL_TABLET | ORAL | 0 refills | Status: DC | PRN

## 2019-03-23 MED ORDER — CIPROFLOXACIN HCL 500 MG PO TABS: 500.0000 mg | ORAL_TABLET | Freq: Two times a day (BID) | ORAL | 0 refills | Status: AC

## 2019-03-23 NOTE — Discharge Instructions (Signed)
Appendicitis, Adult  The appendix is a tube in the body that is shaped like a finger. It is attached to the large intestine. Appendicitis means that this tube is swollen (inflamed). If this is not treated, the tube can tear (rupture). This can lead to a life-threatening infection. This condition can also cause pus to build up in the appendix (abscess). What are the causes? This condition may be caused by something that blocks the appendix. These include:  A ball of poop (stool).  Lymph glands that are bigger than normal. Sometimes the cause is not known. What increases the risk? You are more likely to develop this condition if you are between 10 and 30 years of age. What are the signs or symptoms? Symptoms of this condition include:  Pain around the belly button (navel). ? The pain moves toward the lower right belly (abdomen). ? The pain can get worse with time. ? The pain can get worse if you cough. ? The pain can get worse if you move suddenly.  Tenderness in the lower right belly.  Feeling sick to your stomach (nauseous).  Throwing up (vomiting).  Not feeling hungry (loss of appetite).  A fever.  Having trouble pooping (constipation).  Watery poop (diarrhea).  Not feeling well. How is this treated? Most often, this condition is treated by taking out the appendix (appendectomy). There are two ways to do this:  Open surgery. For this method, the appendix is taken out through a large cut (incision). The cut is made in the lower right belly. This surgery may be used if: ? You have scars from another surgery. ? You have a bleeding condition. ? You are pregnant and will be having your baby soon. ? You have a condition that makes it hard to do the other type of surgery.  Laparoscopic surgery. For this method, the appendix is taken out through small cuts. Often, this surgery: ? Causes less pain. ? Causes fewer problems. ? Is easier to heal from. If your appendix tears and  pus forms:  A drain may be put into the sore. The drain will be used to get rid of the pus.  You may get an antibiotic medicine through an IV line.  Your appendix may or may not need to be taken out. Follow these instructions at home: If you had surgery, follow instructions from your doctor on how to care for yourself at home and how to take care of your cut from surgery. Medicines  Take over-the-counter and prescription medicines only as told by your doctor.  If you were prescribed an antibiotic medicine, take it as told by your doctor. Do not stop taking the antibiotic even if you start to feel better. Eating and drinking Follow instructions from your doctor about what you cannot eat or drink. You may go back to your diet slowly if:  You no longer feel sick to your stomach.  You have stopped throwing up. General instructions  Do not use any products that contain nicotine or tobacco, such as cigarettes, e-cigarettes, and chewing tobacco. If you need help quitting, ask your doctor.  Do not drive or use heavy machinery while taking prescription pain medicine.  Ask your doctor if the medicine you are taking can cause trouble pooping. You may need to take steps to prevent or treat trouble pooping: ? Drink enough fluid to keep your pee (urine) pale yellow. ? Take over-the-counter or prescription medicines. ? Eat foods that are high in fiber. These include beans,   whole grains, and fresh fruits and vegetables. ? Limit foods that are high in fat and sugar. These include fried or sweet foods.  Keep all follow-up visits as told by your doctor. This is important. Contact a doctor if:  There is pus, blood, or a lot of fluid coming from your cut or cuts from surgery.  You are sick to your stomach or you throw up. Get help right away if:  You have pain in your belly, and the pain is getting worse.  You have a fever.  You have chills.  You are very tired.  You have muscle  pain.  You are short of breath. Summary  Appendicitis is swelling of the appendix. The appendix is a tube that is shaped like a finger. It is joined to the large intestine.  This condition may be caused by something that blocks the appendix. This can lead to an infection.  This condition is usually treated by taking out the appendix. This information is not intended to replace advice given to you by your health care provider. Make sure you discuss any questions you have with your health care provider. Document Released: 01/08/2012 Document Revised: 04/03/2018 Document Reviewed: 04/03/2018 Elsevier Interactive Patient Education  2019 Elsevier Inc.  

## 2019-03-23 NOTE — Progress Notes (Addendum)
Verbal order received from Dr Everlene Farrier that the patient can shower as soon as tomorrow and he is to flush his drain with 10ml of saline daily.  Patient given and shown how to flush drain, empty drain, and change dressing

## 2019-03-23 NOTE — Discharge Summary (Signed)
  Patient ID: BRAYDEN MANJARRES MRN: [id number] DOB/AGE: [birth year] 38 y.o.  Admit date: 03/20/2019 Discharge date: 03/23/2019   Discharge Diagnoses:  Active Problems:   Ruptured appendicitis   Procedures: perCutaneous drainage of periappendiceal abscess  Hospital Course: 38 year old male seen and evaluated in the emergency room for acute appendicitis with phlegmon and abscess from perforation.  He was admitted to the hospital hydrated placed on IV antibiotics and interventional radiology placed a percutaneous drain.  He had significant improvement in his overall condition with improvement of his white count and abdominal pain.  At the time of discharge he was ambulating, tolerating regular diet and his vital signs were stable.  Physical exam showed a male in no acute distress.  Awake and alert.  Abdomen: Soft very minimal tenderness to palpation around the drain site.  Drain with some seropurulent drainage.  No peritonitis.  Extremities well-perfused.  Condition of the patient at time of discharge was stable.  He will complete his antibiotic therapy as an outpatient and will see me back in 1 week for drain removal.  He will need interval appendectomy in around 2 months once inflammatory response resolves    Disposition: Discharge disposition: 01-Home or Self Care       Discharge Instructions    Call MD for:  difficulty breathing, headache or visual disturbances   Complete by:  As directed    Call MD for:  extreme fatigue   Complete by:  As directed    Call MD for:  hives   Complete by:  As directed    Call MD for:  persistant dizziness or light-headedness   Complete by:  As directed    Call MD for:  persistant nausea and vomiting   Complete by:  As directed    Call MD for:  redness, tenderness, or signs of infection (pain, swelling, redness, odor or green/yellow discharge around incision site)   Complete by:  As directed    Call MD for:  severe uncontrolled pain   Complete by:   As directed    Call MD for:  temperature >100.4   Complete by:  As directed    Diet - low sodium heart healthy   Complete by:  As directed    Increase activity slowly   Complete by:  As directed      Allergies as of 03/23/2019   No Known Allergies     Medication List    TAKE these medications   acetaZOLAMIDE 250 MG tablet Commonly known as:  DIAMOX Take 250 mg by mouth 2 (two) times a day.   ciprofloxacin 500 MG tablet Commonly known as:  Cipro Take 1 tablet (500 mg total) by mouth 2 (two) times daily for 10 days.   metroNIDAZOLE 500 MG tablet Commonly known as:  FLAGYL Take 1 tablet (500 mg total) by mouth 3 (three) times daily for 10 days.   oxyCODONE-acetaminophen 5-325 MG tablet Commonly known as:  PERCOCET/ROXICET Take 1-2 tablets by mouth every 4 (four) hours as needed for moderate pain.      Follow-up Information    Snook, [place], MD Follow up on 03/31/2019.   Specialty:  General Surgery Contact information: [address] [place] [address] [phone]            Sterling Big, MD FACS

## 2019-03-31 ENCOUNTER — Other Ambulatory Visit: Payer: Self-pay

## 2019-03-31 ENCOUNTER — Ambulatory Visit (INDEPENDENT_AMBULATORY_CARE_PROVIDER_SITE_OTHER): Payer: Self-pay | Admitting: Surgery

## 2019-03-31 ENCOUNTER — Encounter: Payer: Self-pay | Admitting: Surgery

## 2019-03-31 VITALS — BP 122/78 | HR 90 | Temp 97.7°F | Resp 18 | Ht 71.0 in | Wt 210.6 lb

## 2019-03-31 DIAGNOSIS — K3532 Acute appendicitis with perforation and localized peritonitis, without abscess: Secondary | ICD-10-CM

## 2019-03-31 NOTE — Progress Notes (Signed)
Outpatient Surgical Follow Up  03/31/2019  Eric Webster is an 38 y.o. male.   Chief Complaint  Patient presents with  . Follow-up    APPENDICITIS    HPI: Eric Webster is a 38 year old male with perforated appendicitis phlegmon and abscess status post percutaneous drain placement on antibiotics.  He is doing much better with less than 5 cc a day from his drain.  No fevers no chills.  He is taking p.o. well.  No abdominal pain  Past Medical History:  Diagnosis Date  . Episodic ataxia (HCC)   . Renal disorder    kidney stones    Past Surgical History:  Procedure Laterality Date  . HAND SURGERY Right     Family History  Problem Relation Age of Onset  . COPD Mother   . Lung cancer Father     Social History:  reports that he has been smoking cigarettes. He has been smoking about 1.50 packs per day. He has never used smokeless tobacco. He reports current alcohol use. He reports that he does not use drugs.  Allergies: No Known Allergies  Medications reviewed.    ROS Full ROS performed and is otherwise negative other than what is stated in HPI   BP 122/78   Pulse 90   Temp 97.7 F (36.5 C) (Temporal)   Resp 18   Ht 5\' 11"  (1.803 m)   Wt 210 lb 9.6 oz (95.5 kg)   SpO2 96%   BMI 29.37 kg/m   Physical Exam Vitals signs and nursing note reviewed.  Constitutional:      General: He is not in acute distress.    Appearance: Normal appearance. He is normal weight.  Pulmonary:     Effort: Pulmonary effort is normal. No respiratory distress.  Abdominal:     General: Abdomen is flat. There is no distension.     Palpations: There is no mass.     Tenderness: There is no abdominal tenderness. There is no guarding or rebound.     Hernia: No hernia is present.     Comments: Drain removed w/o issues  Skin:    General: Skin is warm and dry.     Capillary Refill: Capillary refill takes less than 2 seconds.     Coloration: Skin is not jaundiced.  Neurological:     General:  No focal deficit present.     Mental Status: He is alert and oriented to person, place, and time.  Psychiatric:        Mood and Affect: Mood normal.        Thought Content: Thought content normal.        Judgment: Judgment normal.        Assessment/Plan:  Appendicitis with abscess status post percutaneous drainage.  Drain removed today.  Doing well and no need for surgical intervention.  I will see him back in 6 weeks and at that time we will schedule elective appendectomy. I spent 15 min in this encounter w > 50% spent in coordination and counseling of his care   Sterling Big, MD [facility] General Surgeon

## 2019-03-31 NOTE — Patient Instructions (Addendum)
Follow up here in 6 weeks on 05/12/19 at 9:15 am with Dr Everlene FarrierPabon    Appendicitis, Adult  The appendix is a tube in the body that is shaped like a finger. It is attached to the large intestine. Appendicitis means that this tube is swollen (inflamed). If this is not treated, the tube can tear (rupture). This can lead to a life-threatening infection. This condition can also cause pus to build up in the appendix (abscess). What are the causes? This condition may be caused by something that blocks the appendix. These include:  A ball of poop (stool).  Lymph glands that are bigger than normal. Sometimes the cause is not known. What increases the risk? You are more likely to develop this condition if you are between 6510 and 40 years of age. What are the signs or symptoms? Symptoms of this condition include:  Pain around the belly button (navel). ? The pain moves toward the lower right belly (abdomen). ? The pain can get worse with time. ? The pain can get worse if you cough. ? The pain can get worse if you move suddenly.  Tenderness in the lower right belly.  Feeling sick to your stomach (nauseous).  Throwing up (vomiting).  Not feeling hungry (loss of appetite).  A fever.  Having trouble pooping (constipation).  Watery poop (diarrhea).  Not feeling well. How is this treated? Most often, this condition is treated by taking out the appendix (appendectomy). There are two ways to do this:  Open surgery. For this method, the appendix is taken out through a large cut (incision). The cut is made in the lower right belly. This surgery may be used if: ? You have scars from another surgery. ? You have a bleeding condition. ? You are pregnant and will be having your baby soon. ? You have a condition that makes it hard to do the other type of surgery.  Laparoscopic surgery. For this method, the appendix is taken out through small cuts. Often, this surgery: ? Causes less pain. ? Causes  fewer problems. ? Is easier to heal from. If your appendix tears and pus forms:  A drain may be put into the sore. The drain will be used to get rid of the pus.  You may get an antibiotic medicine through an IV line.  Your appendix may or may not need to be taken out. Follow these instructions at home: If you had surgery, follow instructions from your doctor on how to care for yourself at home and how to take care of your cut from surgery. Medicines  Take over-the-counter and prescription medicines only as told by your doctor.  If you were prescribed an antibiotic medicine, take it as told by your doctor. Do not stop taking the antibiotic even if you start to feel better. Eating and drinking Follow instructions from your doctor about what you cannot eat or drink. You may go back to your diet slowly if:  You no longer feel sick to your stomach.  You have stopped throwing up. General instructions  Do not use any products that contain nicotine or tobacco, such as cigarettes, e-cigarettes, and chewing tobacco. If you need help quitting, ask your doctor.  Do not drive or use heavy machinery while taking prescription pain medicine.  Ask your doctor if the medicine you are taking can cause trouble pooping. You may need to take steps to prevent or treat trouble pooping: ? Drink enough fluid to keep your pee (urine) pale yellow. ?  Take over-the-counter or prescription medicines. ? Eat foods that are high in fiber. These include beans, whole grains, and fresh fruits and vegetables. ? Limit foods that are high in fat and sugar. These include fried or sweet foods.  Keep all follow-up visits as told by your doctor. This is important. Contact a doctor if:  There is pus, blood, or a lot of fluid coming from your cut or cuts from surgery.  You are sick to your stomach or you throw up. Get help right away if:  You have pain in your belly, and the pain is getting worse.  You have a fever.   You have chills.  You are very tired.  You have muscle pain.  You are short of breath. Summary  Appendicitis is swelling of the appendix. The appendix is a tube that is shaped like a finger. It is joined to the large intestine.  This condition may be caused by something that blocks the appendix. This can lead to an infection.  This condition is usually treated by taking out the appendix. This information is not intended to replace advice given to you by your health care provider. Make sure you discuss any questions you have with your health care provider. Document Released: 01/08/2012 Document Revised: 04/03/2018 Document Reviewed: 04/03/2018 Elsevier Interactive Patient Education  2019 [employer].

## 2019-04-07 ENCOUNTER — Ambulatory Visit: Payer: Self-pay | Admitting: Urology

## 2019-04-17 ENCOUNTER — Encounter: Payer: Self-pay | Admitting: Urology

## 2019-04-17 ENCOUNTER — Other Ambulatory Visit: Payer: Self-pay

## 2019-04-17 ENCOUNTER — Ambulatory Visit: Payer: Self-pay | Admitting: Urology

## 2019-04-17 DIAGNOSIS — N1330 Unspecified hydronephrosis: Secondary | ICD-10-CM

## 2019-05-07 ENCOUNTER — Other Ambulatory Visit: Payer: Self-pay

## 2019-05-07 ENCOUNTER — Encounter: Payer: Self-pay | Admitting: Surgery

## 2019-05-07 ENCOUNTER — Ambulatory Visit (INDEPENDENT_AMBULATORY_CARE_PROVIDER_SITE_OTHER): Payer: Self-pay | Admitting: Surgery

## 2019-05-07 VITALS — BP 122/79 | HR 77 | Temp 97.7°F | Ht 71.0 in | Wt 226.4 lb

## 2019-05-07 DIAGNOSIS — Z1211 Encounter for screening for malignant neoplasm of colon: Secondary | ICD-10-CM

## 2019-05-07 NOTE — Patient Instructions (Signed)
Patient to have a colonoscopy than follow up with office

## 2019-05-07 NOTE — Progress Notes (Signed)
Mr. Winch is a 38 year old male with history of perforated  with abscess requiring drain.  Drain has been removed.  He is doing well.  No fevers no chills no abdominal pain. He is unsure about any family history of colon cancer or polyps.   PE NAD Abd: soft, nt, no peritonitis  A/P He will need interval appendectomy but before that even the unknown history of colon cancer and given his age we both agreed that the best course of action is to perform a colonoscopy.  Will refer to Dr. Vicente Males and after he completes his colonoscopy we will perform an interval appendectomy. Please note that I spent > 25 minutes in this encounter with greater than 50% spent in coordination and counseling of his care

## 2019-05-12 ENCOUNTER — Ambulatory Visit: Payer: Self-pay | Admitting: Surgery

## 2019-05-22 ENCOUNTER — Telehealth: Payer: Self-pay

## 2019-05-22 ENCOUNTER — Telehealth: Payer: Self-pay | Admitting: Gastroenterology

## 2019-05-22 ENCOUNTER — Other Ambulatory Visit: Payer: Self-pay

## 2019-05-22 DIAGNOSIS — Z1211 Encounter for screening for malignant neoplasm of colon: Secondary | ICD-10-CM

## 2019-05-22 MED ORDER — NA SULFATE-K SULFATE-MG SULF 17.5-3.13-1.6 GM/177ML PO SOLN: 1.0000 | Freq: Once | ORAL | 0 refills | Status: AC

## 2019-05-22 NOTE — Telephone Encounter (Signed)
Gastroenterology Pre-Procedure Review  Request Date: 06/02/19 Requesting Physician: Dr. Vicente Males  PATIENT REVIEW QUESTIONS: The patient responded to the following health history questions as indicated:    1. Are you having any GI issues? no 2. Do you have a personal history of Polyps? no 3. Do you have a family history of Colon Cancer or Polyps? no 4. Diabetes Mellitus? no 5. Joint replacements in the past 12 months?no 6. Major health problems in the past 3 months?yes (Ruptured Appendix.  Surgery to be scheduled with Dr. Dahlia Byes.) 7. Any artificial heart valves, MVP, or defibrillator?no    MEDICATIONS & ALLERGIES:    Patient reports the following regarding taking any anticoagulation/antiplatelet therapy:   Plavix, Coumadin, Eliquis, Xarelto, Lovenox, Pradaxa, Brilinta, or Effient? no Aspirin? no  Patient confirms/reports the following medications:  Current Outpatient Medications  Medication Sig Dispense Refill  . acetaZOLAMIDE (DIAMOX) 250 MG tablet Take 250 mg by mouth 2 (two) times a day.    . Na Sulfate-K Sulfate-Mg Sulf 17.5-3.13-1.6 GM/177ML SOLN Take 1 kit by mouth once for 1 dose. 354 mL 0   No current facility-administered medications for this visit.     Patient confirms/reports the following allergies:  No Known Allergies  No orders of the defined types were placed in this encounter.   AUTHORIZATION INFORMATION Primary Insurance: 1D#: Group #:  Secondary Insurance: 1D#: Group #:  SCHEDULE INFORMATION: Date: 06/02/19 Time: Location:[facility]

## 2019-05-22 NOTE — Telephone Encounter (Signed)
Pt wife left vm stating she spoke with someone earlier and scheduled a colonoscopy for pt and is supposed to have a Covid19 test next week she has a few more questions please call

## 2019-05-22 NOTE — Telephone Encounter (Signed)
Patients wife called to inquire on the cost of having the COVID test performed.  I checked with our Lab Tech and she provided me with the customer service at [facility] phone number to give to patient. That phone number is [phone]  Thanks Sharyn Lull

## 2019-05-29 ENCOUNTER — Other Ambulatory Visit
Admission: RE | Admit: 2019-05-29 | Discharge: 2019-05-29 | Disposition: A | Discharge status: Home / Self Care | Payer: HRSA Program | Source: Ambulatory Visit | Attending: Gastroenterology | Admitting: Gastroenterology

## 2019-05-29 ENCOUNTER — Other Ambulatory Visit: Payer: Self-pay

## 2019-05-29 DIAGNOSIS — Z20828 Contact with and (suspected) exposure to other viral communicable diseases: Secondary | ICD-10-CM | POA: Insufficient documentation

## 2019-05-29 LAB — SARS CORONAVIRUS 2 (TAT 6-24 HRS): SARS Coronavirus 2: NEGATIVE

## 2019-06-02 ENCOUNTER — Encounter: Payer: Self-pay | Admitting: Anesthesiology

## 2019-06-02 ENCOUNTER — Encounter
Admission: RE | Disposition: A | Discharge status: Home / Self Care | Payer: Self-pay | Source: Home / Self Care | Attending: Gastroenterology

## 2019-06-02 ENCOUNTER — Ambulatory Visit
Admission: RE | Admit: 2019-06-02 | Discharge: 2019-06-02 | Disposition: A | Discharge status: Home / Self Care | Payer: Self-pay | Attending: Gastroenterology | Admitting: Gastroenterology

## 2019-06-02 ENCOUNTER — Other Ambulatory Visit: Payer: Self-pay

## 2019-06-02 DIAGNOSIS — Z1211 Encounter for screening for malignant neoplasm of colon: Secondary | ICD-10-CM

## 2019-06-02 SURGERY — COLONOSCOPY WITH PROPOFOL
Anesthesia: General

## 2019-06-02 MED ORDER — SODIUM CHLORIDE 0.9 % IV SOLN: INTRAVENOUS | Status: DC

## 2019-06-02 NOTE — Anesthesia Preprocedure Evaluation (Deleted)
Anesthesia Evaluation  Patient identified by MRN, date of birth, ID band Patient awake    Reviewed: Allergy & Precautions, NPO status , Patient's Chart, lab work & pertinent test results  Airway Mallampati: III  TM Distance: >3 FB     Dental   Pulmonary Current Smoker,    Pulmonary exam normal        Cardiovascular negative cardio ROS Normal cardiovascular exam     Neuro/Psych negative neurological ROS  negative psych ROS   GI/Hepatic negative GI ROS, Neg liver ROS,   Endo/Other  negative endocrine ROS  Renal/GU stones  negative genitourinary   Musculoskeletal negative musculoskeletal ROS (+)   Abdominal Normal abdominal exam  (+)   Peds negative pediatric ROS (+)  Hematology negative hematology ROS (+)   Anesthesia Other Findings   Reproductive/Obstetrics                             Anesthesia Physical Anesthesia Plan  ASA: II  Anesthesia Plan: General   Post-op Pain Management:    Induction: Intravenous  PONV Risk Score and Plan: Propofol infusion  Airway Management Planned: Nasal Cannula  Additional Equipment:   Intra-op Plan:   Post-operative Plan:   Informed Consent: I have reviewed the patients History and Physical, chart, labs and discussed the procedure including the risks, benefits and alternatives for the proposed anesthesia with the patient or authorized representative who has indicated his/her understanding and acceptance.     Dental advisory given  Plan Discussed with: CRNA and Surgeon  Anesthesia Plan Comments:         Anesthesia Quick Evaluation

## 2019-06-02 NOTE — OR Nursing (Signed)
Patient registered but did not check in at Surgery Information desk. Multiple attempts to call patient and spouse with not answer. Patient later call the unit to state he had a "panic attack" when trying to come upstairs due to fear of heights, and he went back home. Stated he did not wish to have procedure done today. Instructed to call office to reschedule.

## 2019-06-18 ENCOUNTER — Ambulatory Visit: Payer: Self-pay | Admitting: Surgery

## 2019-07-03 ENCOUNTER — Other Ambulatory Visit: Payer: Self-pay | Admitting: Oncology

## 2019-07-03 DIAGNOSIS — R918 Other nonspecific abnormal finding of lung field: Secondary | ICD-10-CM

## 2019-07-03 NOTE — Progress Notes (Signed)
  Pulmonary Nodule Clinic Telephone Note  Received referral from [facility] ED. He does not have a PCP.  Patient was revaluated by the emergency room for acute appendicitis with phlegmon and abscess perforation.  Subsequent imaging revealed an incidental finding of small bilateral pulmonary nodules measuring up to 6 mm in size. He required a percutaneous drain and was placed on antibiotics.    I personally reviewed all patient's previous imaging including most recent from 03/20/2019 which revealed a 5 mm posterior left lower lobe pulmonary nodule and a  6 mm posterior right lung nodule. A noncontrasted chest CT was recommended in 3 to 6 months.  If nodules remain stable at that time consider a repeat CT in approximately 18 to 24 months.  No additional imaging available for review.  I recommend follow-up with noncontrasted CT scan of the chest within the next month.  Patient is a current everyday smoker.  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  High risk factors include: History of heavy smoking, exposure to asbestos, radium or uranium, personal family history of lung cancer, older age, sex (females greater than males), race (black and native [place] greater than weight), marginal speculation, upper lobe location, multiplicity (less than 5 nodules increases risk for malignancy) and emphysema and/or pulmonary fibrosis.   This recommendation follows the consensus statement: Guidelines for Management of Incidental Pulmonary Nodules Detected on CT Images: From the Fleischner Society 2017; Radiology 2017; 284:228-243.    I have placed order for CT scan without contrast to be completed within the net month.    I will touch base with patient and schedule him/her virtually for results of the CT scan and recommendations per Fleischner's guidelines and our pulmonary nodule clinic.  Faythe Casa, NP 07/03/2019 3:38 PM

## 2019-07-17 ENCOUNTER — Ambulatory Visit: Payer: Self-pay

## 2019-07-31 ENCOUNTER — Ambulatory Visit
Admission: RE | Admit: 2019-07-31 | Discharge: 2019-07-31 | Disposition: A | Discharge status: Home / Self Care | Payer: Self-pay | Source: Ambulatory Visit | Attending: Oncology | Admitting: Oncology

## 2019-07-31 ENCOUNTER — Other Ambulatory Visit: Payer: Self-pay

## 2019-07-31 ENCOUNTER — Encounter: Payer: Self-pay | Admitting: Oncology

## 2019-07-31 DIAGNOSIS — R918 Other nonspecific abnormal finding of lung field: Secondary | ICD-10-CM | POA: Insufficient documentation

## 2019-07-31 NOTE — Progress Notes (Signed)
Patient would like his CT scan results.

## 2019-08-01 ENCOUNTER — Inpatient Hospital Stay: Payer: Self-pay | Attending: Oncology | Admitting: Oncology

## 2019-08-01 ENCOUNTER — Inpatient Hospital Stay (HOSPITAL_BASED_OUTPATIENT_CLINIC_OR_DEPARTMENT_OTHER): Payer: Self-pay | Admitting: Oncology

## 2019-08-01 DIAGNOSIS — R918 Other nonspecific abnormal finding of lung field: Secondary | ICD-10-CM

## 2019-08-01 NOTE — Progress Notes (Signed)
Pulmonary Nodule Clinic Consult note [facility]  Telephone:([phone] Fax:([phone]) [phone]  Patient Care Team: Patient, Eric Webster as PCP - General (General Practice)   Name of the patient: Eric Webster  [id number]  Jun 16, 1981   Date of visit: 08/01/2019   Diagnosis- Lung Nodule  Virtual Visit via Telephone Note   I connected with Eric Webster on 08/18/19 at 11am  by telephone visit and verified that I am speaking with the correct person using two identifiers.   I discussed the limitations, risks, security and privacy concerns of performing an evaluation and management service by telemedicine and the availability of in-person appointments. I also discussed with the patient that there may be a patient responsible charge related to this service. The patient expressed understanding and agreed to proceed.   Other persons participating in the visit and their role in the encounter: Wife  Patient's location: home  Provider's location: office  Chief complaint/ Reason for visit- Pulmonary Nodule Clinic Initial Visit  Past Medical History:  Patient is managed/referred by emergency room for acute appendicitis with Philemon and abscess perforation.  Subsequent imaging revealed an incidental finding of small bilateral pulmonary nodules measuring up to 6 mm in size.  He required a percutaneous drain and was placed on antibiotics for appendicitis.  I personally reviewed all patient's previous imaging including most recent from 03/20/2019 which revealed a 5 mm posterior left lower lobe pulmonary nodule and a 6 mm posterior right lung nodule.  A noncontrasted CT was recommended in 3 to 6 months.  If stable it was recommended he have a repeat in approximately 18 to 24 months.  Interval history-prior medical records, patient has medical history of ruptured appendicitis, kidney stones and episodic ataxia. He has a past surgical history of right hand surgery.  Both his parents are  smokers.  His father has a history of lung cancer in 2017.  Family history of ovarian cancer.  He is married.  He currently smokes.  He is trying to quit.  He smoked for approximately 13 to 14 years.  He is currently not working.  Previous occupations Mining engineer; flooring and building docks.  He denies any occupational exposure to chemicals known to cause cancer.  He has Eric personal history of lung cancer.  In the interim, he has been doing fine.  He is recovering well from from ruptured appendicitis with phlegmon and abscess perforation.  He does not have a PCP.  He is currently not on any medications. He denies any shortness of breath, fevers, chest pain, constipation or diarrhea.   ECOG FS:0 - Asymptomatic  Review of systems- Review of Systems  Constitutional: Negative.  Negative for chills, fever, malaise/fatigue and weight loss.  HENT: Negative for congestion, ear pain and tinnitus.   Eyes: Negative.  Negative for blurred vision and double vision.  Respiratory: Negative.  Negative for cough, sputum production and shortness of breath.   Cardiovascular: Negative.  Negative for chest pain, palpitations and leg swelling.  Gastrointestinal: Negative.  Negative for abdominal pain, constipation, diarrhea, nausea and vomiting.  Genitourinary: Negative for dysuria, frequency and urgency.  Musculoskeletal: Negative for back pain and falls.  Skin: Negative.  Negative for rash.  Neurological: Negative.  Negative for weakness and headaches.  Endo/Heme/Allergies: Negative.  Does not bruise/bleed easily.  Psychiatric/Behavioral: Negative.  Negative for depression. The patient is not nervous/anxious and does not have insomnia.      Eric Known Allergies   Past Medical History:  Diagnosis Date  Episodic ataxia (HCC)    Lung nodules    Renal disorder    kidney stones     Past Surgical History:  Procedure Laterality Date   HAND SURGERY Right     Social History    Socioeconomic History   Marital status: Married    Spouse name: Not on file   Number of children: Not on file   Years of education: Not on file   Highest education level: Not on file  Occupational History   Not on file  Social Needs   Financial resource strain: Not on file   Food insecurity    Worry: Not on file    Inability: Not on file   Transportation needs    Medical: Not on file    Non-medical: Not on file  Tobacco Use   Smoking status: Current Every Day Smoker    Packs/day: 1.50    Years: 10.00    Pack years: 15.00    Types: Cigarettes   Smokeless tobacco: Never Used  Substance and Sexual Activity   Alcohol use: Yes    Comment: 1 pint liquor every weekend   Drug use: Eric   Sexual activity: Not on file  Lifestyle   Physical activity    Days Webster week: Not on file    Minutes Webster session: Not on file   Stress: Not on file  Relationships   Social connections    Talks on phone: Not on file    Gets together: Not on file    Attends religious service: Not on file    Active member of club or organization: Not on file    Attends meetings of clubs or organizations: Not on file    Relationship status: Not on file   Intimate partner violence    Fear of current or ex partner: Not on file    Emotionally abused: Not on file    Physically abused: Not on file    Forced sexual activity: Not on file  Other Topics Concern   Not on file  Social History Narrative   Not on file    Family History  Problem Relation Age of Onset   COPD Mother    Lung cancer Father      Current Outpatient Medications:    acetaZOLAMIDE (DIAMOX) 250 MG tablet, Take 250 mg by mouth 2 (two) times a day., Disp: , Rfl:    Physical Exam: Limited due to telephone visit.  CMP Latest Ref Rng & Units 08/09/2019  Glucose 70 - 99 mg/dL 782(N)  BUN 6 - 20 mg/dL 16  Creatinine 5.62 - 1.30 mg/dL 8.65  Sodium 784 - 696 mmol/L 140  Potassium 3.5 - 5.1 mmol/L 3.5  Chloride 98 -  111 mmol/L 112(H)  CO2 22 - 32 mmol/L 20(L)  Calcium 8.9 - 10.3 mg/dL 9.6  Total Protein 6.5 - 8.1 g/dL 7.6  Total Bilirubin 0.3 - 1.2 mg/dL 0.6  Alkaline Phos 38 - 126 U/L 73  AST 15 - 41 U/L 32  ALT 0 - 44 U/L 45(H)   CBC Latest Ref Rng & Units 08/09/2019  WBC 4.0 - 10.5 K/uL 8.1  Hemoglobin 13.0 - 17.0 g/dL 29.5  Hematocrit 28.4 - 52.0 % 43.5  Platelets 150 - 400 K/uL 254    Eric images are attached to the encounter.  Ct Chest Wo Contrast  Result Date: 07/31/2019 CLINICAL DATA:  38 year old male with lung nodule. EXAM: CT CHEST WITHOUT CONTRAST TECHNIQUE: Multidetector CT imaging of the chest was  performed following the standard protocol without IV contrast. COMPARISON:  Chest radiograph dated 01/17/2011 FINDINGS: Evaluation of this exam is limited in the absence of intravenous contrast. Cardiovascular: There is Eric cardiomegaly or pericardial effusion. The thoracic aorta and central pulmonary arteries are grossly unremarkable on this noncontrast CT. Mediastinum/Nodes: Eric hilar or mediastinal adenopathy. The esophagus and the thyroid gland are grossly unremarkable. Eric mediastinal fluid collection. Lungs/Pleura: There is a 7 mm subpleural nodule the left lower lobe posteriorly (series 3, image 83) and a 6 mm nodule in the right lower lobe posteriorly (series 3 image 83). A 5 mm nodular density noted along the left fissure (series 3 image 67). Several additional smaller subpleural nodule in the right apex (series 3, image 26) and right middle lobe (series 3, image 100) noted. Eric focal consolidation, pleural effusion, pneumothorax. The central airways are patent. Upper Abdomen: There is a stone in the gallbladder. Eric pericholecystic fluid. The visualized upper abdomen is otherwise unremarkable. Musculoskeletal: Eric chest wall mass or suspicious bone lesions identified. IMPRESSION: 1. Multiple bilateral pulmonary nodules as described. The largest nodule measures 7 mm in the left lower lobe.  Non-contrast chest CT at 3-6 months is recommended. If the nodules are stable at time of repeat CT, then future CT at 18-24 months (from today's scan) is considered optional for low-risk patients, but is recommended for high-risk patients. This recommendation follows the consensus statement: Guidelines for Management of Incidental Pulmonary Nodules Detected on CT Images: From the Fleischner Society 2017; Radiology 2017; 284:228-243. 2. Cholelithiasis. Electronically Signed   By: Elgie CollardArash  Radparvar M.D.   On: 07/31/2019 21:52   Ct Abdomen Pelvis W Contrast  Result Date: 08/09/2019 CLINICAL DATA:  Recurrent right lower quadrant pain. Symptoms for 1 month. appendicitis in May. Postoperative drain placement. EXAM: CT ABDOMEN AND PELVIS WITH CONTRAST TECHNIQUE: Multidetector CT imaging of the abdomen and pelvis was performed using the standard protocol following bolus administration of intravenous contrast. CONTRAST:  100mL OMNIPAQUE IOHEXOL 300 MG/ML  SOLN COMPARISON:  03/20/2019 FINDINGS: Lower chest: Clear lung bases. Normal heart size without pericardial or pleural effusion. Hepatobiliary: Normal liver. Normal gallbladder, without biliary ductal dilatation. Pancreas: Normal, without mass or ductal dilatation. Spleen: Normal in size, without focal abnormality. Adrenals/Urinary Tract: Normal adrenal glands. Punctate lower pole right renal collecting system calculus. Resolution of hydronephrosis since 03/20/2019. Eric hydroureter, ureteric, or bladder calculi. Stomach/Bowel: Normal stomach, without wall thickening. Normal colon and terminal ileum. Appendectomy. Resolution of pericecal fluid collection since the prior. Normal small bowel.  Eric free intraperitoneal air. Vascular/Lymphatic: Aortic atherosclerosis. Eric abdominopelvic adenopathy. Reproductive: Normal prostate. Other: Eric significant free fluid. Musculoskeletal: Eric acute osseous abnormality. IMPRESSION: 1.  Eric acute process or explanation for right lower  quadrant pain. 2. Right nephrolithiasis, without obstructive uropathy. Electronically Signed   By: Jeronimo GreavesKyle  Talbot M.D.   On: 08/09/2019 18:23     Assessment and plan- Patient is a 38 y.o. male who presents to pulmonary nodule clinic for follow-up of incidental lung nodules.  A telephone visit was conducted to review most recent CT scan results.    CT chest without contrast from today 07/31/2019 revealed a lung nodule measuring 7 mm in the left lower lobe.  There were several other bilateral pulmonary nodules.  A noncontrasted CT is recommended in 3 to 6 months to confirm stability.  If stable at that time it is recommended he have a repeat CT without contrast 18-24 months later.   Calculating malignancy probability of a pulmonary nodule: Risk factors include:  1.  Age. 2.  Cancer history. 3.  Diameter of pulmonary nodule and mm 4.  Location 5.  Smoking history 6.  Spiculation present   Based on risk factors, this patient is  moderate risk for the development of lung cancer even in absence of a lung nodule.  I would recommend a 62-month follow-up with imaging to ensure stability given smoking history and slight increase of previously noted pulmonary nodule from 5 mm to 7 mm in LLL. Stable RLL nodule measuring 6 mm and 5 mm nodular density along left fissure.  If imaging stable in 49-months, would recommend follow-up with annual low-dose CT screening. Will make referral if appropriate.    During our visit, we discussed pulmonary nodules are a common incidental finding and are often how lung cancer is discovered.  Lung cancer survival is directly related to the stage at diagnosis.  We discussed that nodules can vary in presentation from solitary pulmonary nodules to masses, 2 groundglass opacities and multiple nodules.  Pulmonary nodules in the majority of cases are benign but the probability of these becoming malignant cannot be undermined.  Early identification of malignant nodules could lead to early  diagnosis and increased survival.   We discussed the probability of pulmonary nodules becoming malignant increase with age, pack years of tobacco use, size/characteristics of the nodule and location; with upper lobe involvement being most worrisome.   We discussed the goal of our clinic is to thoroughly evaluate each nodule, developed a comprehensive, individualized plan of care utilizing the most advanced technology and significantly reduce the time from detection to treatment.  A dedicated pulmonary nodule clinic has proven to indeed expedite the detection and treatment of lung cancer.   Patient education in fact sheet provided along with most recent CT scans.  Disposition: CT chest w.out contrast in 12 months. Orders Placed.  RTC in approximately 6 months for repeat evaluation and results from CT scan.   Visit Diagnosis 1. Lung nodules     Patient expressed understanding and was in agreement with this plan. He also understands that He can call clinic at any time with any questions, concerns, or complaints.   Greater than 50% was spent in counseling and coordination of care with this patient including but not limited to discussion of the relevant topics above (See A&P) including, but not limited to diagnosis and management of acute and chronic medical conditions.   Thank you for allowing me to participate in the care of this very pleasant patient.    Mauro Kaufmann, NP [facility] at [facility] Cell - [phone] Pager- [phone] 08/18/2019 9:29 AM

## 2019-08-07 ENCOUNTER — Telehealth: Payer: Self-pay | Admitting: *Deleted

## 2019-08-07 ENCOUNTER — Other Ambulatory Visit: Payer: Self-pay | Admitting: Oncology

## 2019-08-07 DIAGNOSIS — R918 Other nonspecific abnormal finding of lung field: Secondary | ICD-10-CM

## 2019-08-07 NOTE — Telephone Encounter (Signed)
Pt's wife has been made aware of upcoming appts for follow up CT scan and follow up appt with Faythe Casa, NP in the Lung Nodule Clinic. Pt's wife verbalized understanding. Nothing further needed at this time.

## 2019-08-09 ENCOUNTER — Other Ambulatory Visit: Payer: Self-pay

## 2019-08-09 ENCOUNTER — Encounter: Payer: Self-pay | Admitting: Emergency Medicine

## 2019-08-09 ENCOUNTER — Emergency Department: Payer: Self-pay

## 2019-08-09 ENCOUNTER — Emergency Department
Admission: EM | Admit: 2019-08-09 | Discharge: 2019-08-09 | Disposition: A | Discharge status: Home / Self Care | Payer: Self-pay | Attending: Emergency Medicine | Admitting: Emergency Medicine

## 2019-08-09 DIAGNOSIS — R1031 Right lower quadrant pain: Secondary | ICD-10-CM | POA: Insufficient documentation

## 2019-08-09 DIAGNOSIS — F17210 Nicotine dependence, cigarettes, uncomplicated: Secondary | ICD-10-CM | POA: Insufficient documentation

## 2019-08-09 DIAGNOSIS — Z79899 Other long term (current) drug therapy: Secondary | ICD-10-CM | POA: Insufficient documentation

## 2019-08-09 LAB — URINALYSIS, COMPLETE (UACMP) WITH MICROSCOPIC
Bilirubin Urine: NEGATIVE
Glucose, UA: NEGATIVE mg/dL
Hgb urine dipstick: NEGATIVE
Ketones, ur: NEGATIVE mg/dL
Leukocytes,Ua: NEGATIVE
Nitrite: NEGATIVE
Protein, ur: NEGATIVE mg/dL
Specific Gravity, Urine: 1.017 (ref 1.005–1.030)
pH: 6 (ref 5.0–8.0)

## 2019-08-09 LAB — COMPREHENSIVE METABOLIC PANEL
ALT: 45 U/L — ABNORMAL HIGH (ref 0–44)
AST: 32 U/L (ref 15–41)
Albumin: 4.3 g/dL (ref 3.5–5.0)
Alkaline Phosphatase: 73 U/L (ref 38–126)
Anion gap: 8 (ref 5–15)
BUN: 16 mg/dL (ref 6–20)
CO2: 20 mmol/L — ABNORMAL LOW (ref 22–32)
Calcium: 9.6 mg/dL (ref 8.9–10.3)
Chloride: 112 mmol/L — ABNORMAL HIGH (ref 98–111)
Creatinine, Ser: 0.94 mg/dL (ref 0.61–1.24)
GFR calc Af Amer: >60 mL/min (ref >60)
GFR calc non Af Amer: >60 mL/min (ref >60)
Glucose, Bld: 117 mg/dL — ABNORMAL HIGH (ref 70–99)
Potassium: 3.5 mmol/L (ref 3.5–5.1)
Sodium: 140 mmol/L (ref 135–145)
Total Bilirubin: 0.6 mg/dL (ref 0.3–1.2)
Total Protein: 7.6 g/dL (ref 6.5–8.1)

## 2019-08-09 LAB — CBC
HCT: 43.5 % (ref 39.0–52.0)
Hemoglobin: 14.9 g/dL (ref 13.0–17.0)
MCH: 29.6 pg (ref 26.0–34.0)
MCHC: 34.3 g/dL (ref 30.0–36.0)
MCV: 86.3 fL (ref 80.0–100.0)
Platelets: 254 10*3/uL (ref 150–400)
RBC: 5.04 MIL/uL (ref 4.22–5.81)
RDW: 12.2 % (ref 11.5–15.5)
WBC: 8.1 10*3/uL (ref 4.0–10.5)
nRBC: 0 % (ref 0.0–0.2)

## 2019-08-09 LAB — LIPASE, BLOOD: Lipase: 27 U/L (ref 11–51)

## 2019-08-09 MED ORDER — IOHEXOL 300 MG/ML  SOLN
100.0000 mL | Freq: Once | INTRAMUSCULAR | Status: AC | PRN
  Administered 2019-08-09: 18:00:00 100 mL via INTRAVENOUS

## 2019-08-09 MED ORDER — IOHEXOL 9 MG/ML PO SOLN
500.0000 mL | Freq: Two times a day (BID) | ORAL | Status: DC | PRN
  Administered 2019-08-09 (×2): 500 mL via ORAL
  Filled 2019-08-09 (×2): qty 500

## 2019-08-09 MED ORDER — SODIUM CHLORIDE 0.9% FLUSH: 3.0000 mL | Freq: Once | INTRAVENOUS | Status: DC

## 2019-08-09 NOTE — ED Notes (Signed)
RN called to ask CT about delay in results. Pt then updated on delay. RN apologized but pt verbalized understanding.

## 2019-08-09 NOTE — ED Triage Notes (Signed)
Lower R abd pain x 1 month.

## 2019-08-09 NOTE — ED Notes (Signed)
Pt finished drinking contrast. CT notified. 

## 2019-08-09 NOTE — ED Notes (Signed)
Pt has returned from CT scan. NAD at this time.

## 2019-08-09 NOTE — ED Notes (Signed)
MD at bedside. 

## 2019-08-09 NOTE — ED Provider Notes (Signed)
[facility] Emergency Department Provider Note   ____________________________________________   First MD Initiated Contact with Patient 08/09/19 1524     (approximate)  I have reviewed the triage vital signs and the nursing notes.   HISTORY  Chief Complaint Abdominal Pain    HPI Eric Webster is a 38 y.o. male with possible history of ruptured appendicitis presents to the ED complaining of abdominal pain.  Patient reports he has had approximately 1 month of intermittent right lower quadrant abdominal pain.  He describes it as sharp, not exacerbated or alleviated by anything in particular.  He has not had any fevers and denies any nausea, vomiting, diarrhea, flank pain, or urinary symptoms.  He is concerned because when he was admitted in May he was told he would need an appendectomy eventually, but he has been unable to get this scheduled.        Past Medical History:  Diagnosis Date  . Episodic ataxia (HCC)   . Lung nodules   . Renal disorder    kidney stones    Patient Active Problem List   Diagnosis Date Noted  . Ruptured appendicitis 03/20/2019  . Episodic ataxia (HCC) 01/08/2018    Past Surgical History:  Procedure Laterality Date  . HAND SURGERY Right     Prior to Admission medications   Medication Sig Start Date End Date Taking? Authorizing Provider  acetaZOLAMIDE (DIAMOX) 250 MG tablet Take 250 mg by mouth 2 (two) times a day. 02/07/19   [provider]    Allergies Patient has no known allergies.  Family History  Problem Relation Age of Onset  . COPD Mother   . Lung cancer Father     Social History Social History   Tobacco Use  . Smoking status: Current Every Day Smoker    Packs/day: 1.50    Years: 10.00    Pack years: 15.00    Types: Cigarettes  . Smokeless tobacco: Never Used  Substance Use Topics  . Alcohol use: Yes    Comment: 1 pint liquor every weekend  . Drug use: No    Review of Systems  Constitutional: No fever/chills Eyes: No visual changes. ENT: No sore throat. Cardiovascular: Denies chest pain. Respiratory: Denies shortness of breath. Gastrointestinal: Positive for abdominal pain.  No nausea, no vomiting.  No diarrhea.  No constipation. Genitourinary: Negative for dysuria. Musculoskeletal: Negative for back pain. Skin: Negative for rash. Neurological: Negative for headaches, focal weakness or numbness.  ____________________________________________   PHYSICAL EXAM:  VITAL SIGNS: ED Triage Vitals  Enc Vitals Group     BP 08/09/19 1444 (!) 138/94     Pulse Rate 08/09/19 1444 83     Resp 08/09/19 1444 20     Temp 08/09/19 1444 98.8 F (37.1 C)     Temp Source 08/09/19 1444 Oral     SpO2 08/09/19 1444 94 %     Weight 08/09/19 1445 235 lb (106.6 kg)     Height 08/09/19 1445 5\' 11"  (1.803 m)     Head Circumference --      Peak Flow --      Pain Score 08/09/19 1445 5     Pain Loc --      Pain Edu? --      Excl. in GC? --     Constitutional: Alert and oriented. Eyes: Conjunctivae are normal. Head: Atraumatic. Nose: No congestion/rhinnorhea. Mouth/Throat: Mucous membranes are moist. Neck: Normal ROM Cardiovascular: Normal rate, regular rhythm. Grossly normal heart  sounds. Respiratory: Normal respiratory effort.  No retractions. Lungs CTAB. Gastrointestinal: Soft and tender to palpation in the right lower quadrant with no rebound or guarding. No distention. Genitourinary: deferred Musculoskeletal: No lower extremity tenderness nor edema. Neurologic:  Normal speech and language. No gross focal neurologic deficits are appreciated. Skin:  Skin is warm, dry and intact. No rash noted. Psychiatric: Mood and affect are normal. Speech and behavior are normal.  ____________________________________________   LABS (all labs ordered are listed, but only abnormal results are displayed)  Labs Reviewed  COMPREHENSIVE METABOLIC PANEL - Abnormal; Notable for the  following components:      Result Value   Chloride 112 (*)    CO2 20 (*)    Glucose, Bld 117 (*)    ALT 45 (*)    All other components within normal limits  URINALYSIS, COMPLETE (UACMP) WITH MICROSCOPIC - Abnormal; Notable for the following components:   Color, Urine YELLOW (*)    APPearance HAZY (*)    Bacteria, UA RARE (*)    All other components within normal limits  LIPASE, BLOOD  CBC     PROCEDURES  Procedure(s) performed (including Critical Care):  Procedures   ____________________________________________   INITIAL IMPRESSION / ASSESSMENT AND PLAN / ED COURSE       38 year old male presents to the ED with about 1 month of intermittent right lower quadrant abdominal pain after being admitted in May for ruptured appendicitis.  Will assess for phlegmon, abscess, or other complication of prior ruptured appendicitis with CT scan.  His labs thus far are unremarkable and he declines any pain medication. CT scan negative for acute process.  Reviewing chart, it was recommended that he undergo colonoscopy prior to scheduling of appendectomy.  Counseled patient to schedule with GI and he states he has contact information available.  Counseled to return to the ED for new or worsening symptoms, patient agrees with plan.     ____________________________________________   FINAL CLINICAL IMPRESSION(S) / ED DIAGNOSES  Final diagnoses:  Right lower quadrant pain     ED Discharge Orders    None       Note:  This document was prepared using Dragon voice recognition software and may include unintentional dictation errors.   Blake Divine, MD 08/09/19 [phone]

## 2019-08-21 ENCOUNTER — Encounter: Payer: Self-pay | Admitting: *Deleted

## 2019-08-25 ENCOUNTER — Telehealth: Payer: Self-pay | Admitting: *Deleted

## 2019-08-25 NOTE — Telephone Encounter (Signed)
Patient needs to be seen due to have his appendix removed but he need to have a colonoscopy first. We sent him to [facility] but he absolutely can not go upstairs due to having a phobia. But patient really wants to get moving on this process. Please call and advise

## 2019-08-25 NOTE — Telephone Encounter (Signed)
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  [place] [facility] is one level. Patient was instructed to call and speak with  [facility] and request colonoscopy to be scheduled due to his phobia.  Patient states he cannot go to [facility] due to being a second level and having to ride the elevator. He is terrified of heights.

## 2019-08-27 NOTE — Progress Notes (Signed)
Patient will have a virtual appointment on 08/01/2019.  This note is opened up and error. Faythe Casa, NP 08/27/2019 2:45 PM

## 2019-09-16 ENCOUNTER — Telehealth: Payer: Self-pay

## 2019-09-16 NOTE — Telephone Encounter (Signed)
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 They will call our office and let us know when it has been scheduled so we can follow up with him.

## 2019-10-04 ENCOUNTER — Emergency Department
Admission: EM | Admit: 2019-10-04 | Discharge: 2019-10-04 | Disposition: A | Discharge status: Home / Self Care | Payer: Self-pay | Attending: Student | Admitting: Student

## 2019-10-04 ENCOUNTER — Other Ambulatory Visit: Payer: Self-pay

## 2019-10-04 DIAGNOSIS — Z87891 Personal history of nicotine dependence: Secondary | ICD-10-CM | POA: Insufficient documentation

## 2019-10-04 DIAGNOSIS — J028 Acute pharyngitis due to other specified organisms: Secondary | ICD-10-CM

## 2019-10-04 DIAGNOSIS — Z20828 Contact with and (suspected) exposure to other viral communicable diseases: Secondary | ICD-10-CM | POA: Insufficient documentation

## 2019-10-04 DIAGNOSIS — B9789 Other viral agents as the cause of diseases classified elsewhere: Secondary | ICD-10-CM

## 2019-10-04 DIAGNOSIS — B349 Viral infection, unspecified: Secondary | ICD-10-CM | POA: Insufficient documentation

## 2019-10-04 LAB — GROUP A STREP BY PCR: Group A Strep by PCR: NOT DETECTED

## 2019-10-04 LAB — INFLUENZA PANEL BY PCR (TYPE A & B)
Influenza A By PCR: NEGATIVE
Influenza B By PCR: NEGATIVE

## 2019-10-04 MED ORDER — LIDOCAINE VISCOUS HCL 2 % MT SOLN: 5.0000 mL | Freq: Four times a day (QID) | OROMUCOSAL | 0 refills | Status: AC | PRN

## 2019-10-04 MED ORDER — IBUPROFEN 600 MG PO TABS: 600.0000 mg | ORAL_TABLET | Freq: Three times a day (TID) | ORAL | 0 refills | Status: AC | PRN

## 2019-10-04 NOTE — ED Triage Notes (Addendum)
FIRST NURSE NOTE- pt c/o cough, congestion, body aches. Denies fevers.  Ambulatory. Unlabored. No major medical problems.

## 2019-10-04 NOTE — ED Notes (Signed)
Pt returned for COVID swab recollect. Swab collected and walked to lab.

## 2019-10-04 NOTE — ED Triage Notes (Signed)
Pt c/o cough with sore throat and body ache for the past 3-4 days, denies any other sx

## 2019-10-04 NOTE — ED Notes (Signed)
Pt presents to the ED from home c/o generalized body aches, sore throat, and cough for the last 3-4 days. Pt denies fever and SOB. Pt in NAD during assessment

## 2019-10-04 NOTE — ED Provider Notes (Signed)
[facility] Emergency Department Provider Note   ____________________________________________   First MD Initiated Contact with Patient 10/04/19 1107     (approximate)  I have reviewed the triage vital signs and the nursing notes.   HISTORY  Chief Complaint URI    HPI Eric Webster is a 38 y.o. male patient complain of cough, sore throat, congestion, and body aches.  Patient denies fever/chills.  Denies nausea, vomiting, diarrhea.  No palliative measure for complaint.      Past Medical History:  Diagnosis Date  . 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 Lung nodules   . Renal disorder    kidney stones    Patient Active Problem List   Diagnosis Date Noted  . Ruptured appendicitis 03/20/2019  . 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 HAND SURGERY Right     Prior to Admission medications   Medication Sig Start Date End Date Taking? Authorizing Provider  acetaZOLAMIDE (DIAMOX) 250 MG tablet Take 250 mg by mouth 2 (two) times a day. 02/07/19   [provider]  ibuprofen (ADVIL) 600 MG tablet Take 1 tablet (600 mg total) by mouth every 8 (eight) hours as needed. 10/04/19   Sable Feil, PA-C  lidocaine (XYLOCAINE) 2 % solution Use as directed 5 mLs in the mouth or throat every 6 (six) hours as needed for mouth pain. 10/04/19   Sable Feil, PA-C    Allergies Patient has no known allergies.  Family History  Problem Relation Age of Onset  . COPD Mother   . Lung cancer Father     Social History Social History   Tobacco Use  . Smoking status: Former Smoker    Packs/day: 1.50    Years: 10.00    Pack years: 15.00    Types: Cigarettes  . Smokeless tobacco: Never Used  Substance Use Topics  . Alcohol use: Not Currently    Comment: 1 pint liquor every weekend  . Drug use: No    Review of Systems Constitutional: No fever/chills Eyes: No visual changes. ENT: No sore throat.  Cardiovascular: Denies chest pain. Respiratory: Denies shortness of breath. Gastrointestinal: No abdominal pain.  No nausea, no vomiting.  No diarrhea.  No constipation. Genitourinary: Negative for dysuria. Musculoskeletal: Negative for back pain. Skin: Negative for rash. Neurological: Negative for headaches, focal weakness or numbness.  Episodic ataxia.  ____________________________________________   PHYSICAL EXAM:  VITAL SIGNS: ED Triage Vitals  Enc Vitals Group     BP 10/04/19 1100 132/84     Pulse Rate 10/04/19 1100 62     Resp 10/04/19 1100 17     Temp 10/04/19 1100 97.7 F (36.5 C)     Temp Source 10/04/19 1100 Oral     SpO2 10/04/19 1100 100 %     Weight 10/04/19 1101 230 lb (104.3 kg)     Height 10/04/19 1101 5\' 11"  (1.803 m)     Head Circumference --      Peak Flow --      Pain Score 10/04/19 1101 0     Pain Loc --      Pain Edu? --      Excl. in [place]? --    Constitutional: Alert and oriented. Well appearing and in no acute distress. Eyes: Conjunctivae are normal. PERRL. EOMI. Head: Atraumatic. Nose: Edematous nasal turbinates.   Mouth/Throat: Mucous membranes are moist.  Oropharynx erythematous. Neck: No stridor.   Hematological/Lymphatic/Immunilogical: No  cervical lymphadenopathy. Cardiovascular: Normal rate, regular rhythm. Grossly normal heart sounds.  Good peripheral circulation. Respiratory: Normal respiratory effort.  No retractions. Lungs CTAB. Musculoskeletal: No lower extremity tenderness nor edema.  No joint effusions. Neurologic:  Normal speech and language. No gross focal neurologic deficits are appreciated. No gait instability. Skin:  Skin is warm, dry and intact. No rash noted. Psychiatric: Mood and affect are normal. Speech and behavior are normal.  ____________________________________________   LABS (all labs ordered are listed, but only abnormal results are displayed)  Labs Reviewed  GROUP A STREP BY PCR  SARS CORONAVIRUS 2 (TAT 6-24 HRS)   INFLUENZA PANEL BY PCR (TYPE A & B)   ____________________________________________  EKG   ____________________________________________  RADIOLOGY  ED MD interpretation:    Official radiology report(s): No results found.  ____________________________________________   PROCEDURES  Procedure(s) performed (including Critical Care):  Procedures   ____________________________________________   INITIAL IMPRESSION / ASSESSMENT AND PLAN / ED COURSE  As part of my medical decision making, I reviewed the following data within the electronic MEDICAL RECORD NUMBER     Patient presents with cough, congestion, body aches, sore throat.  Discussed negative strep and flu results with patient.  Advise Covid test pending.  Patient advised self quarantine pending results of test.  Follow-up PCP.    Eric Webster was evaluated in Emergency Department on 10/04/2019 for the symptoms described in the history of present illness. He was evaluated in the context of the global COVID-19 pandemic, which necessitated consideration that the patient might be at risk for infection with the SARS-CoV-2 virus that causes COVID-19. Institutional protocols and algorithms that pertain to the evaluation of patients at risk for COVID-19 are in a state of rapid change based on information released by regulatory bodies including the CDC and federal and state organizations. These policies and algorithms were followed during the patient's care in the ED.       ____________________________________________   FINAL CLINICAL IMPRESSION(S) / ED DIAGNOSES  Final diagnoses:  Viral illness  Sore throat (viral)     ED Discharge Orders         Ordered    lidocaine (XYLOCAINE) 2 % solution  Every 6 hours PRN     10/04/19 1226    ibuprofen (ADVIL) 600 MG tablet  Every 8 hours PRN     10/04/19 1226           Note:  This document was prepared using Dragon voice recognition software and may include  unintentional dictation errors.    Joni Reining, PA-C 10/04/19 1244    Miguel Aschoff., MD 10/04/19 [phone]

## 2019-10-04 NOTE — ED Notes (Signed)
Lab notified that Covid swab was rejected. Patient notified. Patient states that he will come back to the ED to have the swab recollected.

## 2019-10-04 NOTE — Discharge Instructions (Signed)
Self quarantine pending results of COVID-19 test.

## 2019-10-05 LAB — SARS CORONAVIRUS 2 (TAT 6-24 HRS): SARS Coronavirus 2: NEGATIVE

## 2020-01-13 ENCOUNTER — Other Ambulatory Visit: Payer: Self-pay

## 2020-01-13 ENCOUNTER — Ambulatory Visit
Admission: RE | Admit: 2020-01-13 | Discharge: 2020-01-13 | Disposition: A | Discharge status: Home / Self Care | Payer: Self-pay | Source: Ambulatory Visit | Attending: Oncology | Admitting: Oncology

## 2020-01-13 DIAGNOSIS — R918 Other nonspecific abnormal finding of lung field: Secondary | ICD-10-CM | POA: Insufficient documentation

## 2020-01-14 ENCOUNTER — Inpatient Hospital Stay: Payer: Self-pay | Attending: Oncology | Admitting: Oncology

## 2020-01-14 DIAGNOSIS — R918 Other nonspecific abnormal finding of lung field: Secondary | ICD-10-CM

## 2020-01-14 NOTE — Progress Notes (Signed)
Pulmonary Nodule Clinic Consult note [facility]  [phone] [phone]  Patient Care Team: Patient, No Pcp Per as PCP - General (General Practice)   Name of the patient: Eric Webster  [id number]  Jul 04, 1981   Date of visit: 01/14/2020   Diagnosis- Lung Nodule  Virtual Visit via Telephone Note   I connected with Eric Webster on 01/14/20 at 11am  by telephone visit and verified that I am speaking with the correct person using two identifiers.   I discussed the limitations, risks, security and privacy concerns of performing an evaluation and management service by telemedicine and the availability of in-person appointments. I also discussed with the patient that there may be a patient responsible charge related to this service. The patient expressed understanding and agreed to proceed.   Other persons participating in the visit and their role in the encounter: Wife  Patient's location: home  Provider's location: office  Chief complaint/ Reason for visit- Pulmonary Nodule Clinic Initial Visit  Past Medical History:  Patient is managed/referred by emergency room for acute appendicitis with Eric Webster and abscess perforation.  Subsequent imaging revealed an incidental finding of small bilateral pulmonary nodules measuring up to 6 mm in size.  He required a percutaneous drain and was placed on antibiotics for appendicitis.  I personally reviewed all patient's previous imaging:   CT chest from 07/31/2019 revealed multiple bilateral pulmonary nodules measuring 7 mm in the left lower lobe and 6 mm in the right lower lobe.  A 5 mm nodule density noted along the left fissure and several smaller subpleural nodules in the right apex and right middle lobe.  CT abdomen pelvis from 03/20/2019 revealed a 5 mm posterior left lower lobe pulmonary nodule and a 6 mm posterior right lung nodule.  Interval history-patient presents to pulmonary nodule clinic today  to review CT scan results.  Reviewed past medical records: History of ruptured appendicitis kidney stones and episodic ataxia.   In the interim, patient was evaluated in the emergency room on 2 occasions.  He was seen on 08/09/2019 for abdominal pain.  Work-up included CT scan which was negative for acute process.  It was recommended he be seen by GI prior to his appendectomy.  Unfortunately, patient has a fear of heights and is unable to climb stairs or ride an elevator.  They decided to postpone colonoscopy for now.  He was evaluated in the emergency room again on 10/04/2019 for upper respiratory infection-like symptoms including cough, sore throat, congestion and body aches.  COVID-19 swab was rejected.  He was treated for viral illness and sent home with ibuprofen and lidocaine solution for sore throat.  He was evaluated by neurologist Eric Webster for episodic ataxia.  His Diamox was increased.  It appears previously discussed symptoms of dizziness and abnormal sensation in legs has significantly improved. He able to drive and work daily since manipulation of his medications.   He has a past surgical history of right hand surgery.  Both his parents are smokers.  His father has a history of lung cancer in 2017.  Family history of ovarian cancer.  He is married.  He recently quit smoking and he does not drink alcohol.  He smoked 1 pack of cigarettes per day for approximately 13 to 14 years.   He denies any known occupational exposure to chemicals known to cause cancer.  He has no personal history of lung cancer.  In the interim, he has been doing great.  He started  working again and is able to drive.  His quality of life has significantly improved since manipulation of his Diamox for episodic ataxia.  He was referred for occupational therapy for a driving simulation assessment which he completed and past.  His son was recently diagnosed with episodic ataxia type II which is genetic.  He is going to  schedule his colonoscopy soon.  He will then likely have an appendectomy.  He denies any shortness of breath, chest pain, nausea, vomiting, fever or recent illness.   ECOG FS:0 - Asymptomatic  Review of systems- Review of Systems  Constitutional: Negative.  Negative for chills, fever, malaise/fatigue and weight loss.  HENT: Negative for congestion, ear pain and tinnitus.   Eyes: Negative.  Negative for blurred vision and double vision.  Respiratory: Negative.  Negative for cough, sputum production and shortness of breath.   Cardiovascular: Negative.  Negative for chest pain, palpitations and leg swelling.  Gastrointestinal: Negative.  Negative for abdominal pain, constipation, diarrhea, nausea and vomiting.  Genitourinary: Negative for dysuria, frequency and urgency.  Musculoskeletal: Negative for back pain and falls.  Skin: Negative.  Negative for rash.  Neurological: Negative.  Negative for weakness and headaches.  Endo/Heme/Allergies: Negative.  Does not bruise/bleed easily.  Psychiatric/Behavioral: Negative.  Negative for depression. The patient is not nervous/anxious and does not have insomnia.      No Known Allergies   Past Medical History:  Diagnosis Date  . Episodic ataxia ([facility])   . Lung nodules   . Renal disorder    kidney stones     Past Surgical History:  Procedure Laterality Date  . HAND SURGERY Right     Social History   Socioeconomic History  . Marital status: Married    Spouse name: Not on file  . Number of children: Not on file  . Years of education: Not on file  . Highest education level: Not on file  Occupational History  . Not on file  Tobacco Use  . Smoking status: Former Smoker    Packs/day: 1.50    Years: 10.00    Pack years: 15.00    Types: Cigarettes  . Smokeless tobacco: Never Used  Substance and Sexual Activity  . Alcohol use: Not Currently    Comment: 1 pint liquor every weekend  . Drug use: No  . Sexual activity: Not on file    Other Topics Concern  . Not on file  Social History Narrative  . Not on file   Social Determinants of Health   Financial Resource Strain:   . Difficulty of Paying Living Expenses:   Food Insecurity:   . Worried About Programme researcher, broadcasting/film/video in the Last Year:   . Barista in the Last Year:   Transportation Needs:   . Freight forwarder (Medical):   Marland Kitchen Lack of Transportation (Non-Medical):   Physical Activity:   . Days of Exercise per Week:   . Minutes of Exercise per Session:   Stress:   . Feeling of Stress :   Social Connections:   . Frequency of Communication with Friends and Family:   . Frequency of Social Gatherings with Friends and Family:   . Attends Religious Services:   . Active Member of Clubs or Organizations:   . Attends Banker Meetings:   Marland Kitchen Marital Status:   Intimate Partner Violence:   . Fear of Current or Ex-Partner:   . Emotionally Abused:   Marland Kitchen Physically Abused:   . Sexually Abused:  Family History  Problem Relation Age of Onset  . COPD Mother   . Lung cancer Father      Current Outpatient Medications:  .  acetaZOLAMIDE (DIAMOX) 250 MG tablet, Take 250 mg by mouth 2 (two) times a day., Disp: , Rfl:  .  ibuprofen (ADVIL) 600 MG tablet, Take 1 tablet (600 mg total) by mouth every 8 (eight) hours as needed., Disp: 15 tablet, Rfl: 0 .  lidocaine (XYLOCAINE) 2 % solution, Use as directed 5 mLs in the mouth or throat every 6 (six) hours as needed for mouth pain., Disp: 100 mL, Rfl: 0   Physical Exam: Limited due to telephone visit.  CMP Latest Ref Rng & Units 08/09/2019  Glucose 70 - 99 mg/dL 948(A)  BUN 6 - 20 mg/dL 16  Creatinine 1.65 - 5.37 mg/dL 4.82  Sodium 707 - 867 mmol/L 140  Potassium 3.5 - 5.1 mmol/L 3.5  Chloride 98 - 111 mmol/L 112(H)  CO2 22 - 32 mmol/L 20(L)  Calcium 8.9 - 10.3 mg/dL 9.6  Total Protein 6.5 - 8.1 g/dL 7.6  Total Bilirubin 0.3 - 1.2 mg/dL 0.6  Alkaline Phos 38 - 126 U/L 73  AST 15 - 41 U/L 32   ALT 0 - 44 U/L 45(H)   CBC Latest Ref Rng & Units 08/09/2019  WBC 4.0 - 10.5 K/uL 8.1  Hemoglobin 13.0 - 17.0 g/dL 54.4  Hematocrit 92.0 - 52.0 % 43.5  Platelets 150 - 400 K/uL 254    No images are attached to the encounter.  CT Chest Wo Contrast  Result Date: 01/13/2020 CLINICAL DATA:  Follow-up lung nodules, smoking history EXAM: CT CHEST WITHOUT CONTRAST TECHNIQUE: Multidetector CT imaging of the chest was performed following the standard protocol without IV contrast. COMPARISON:  07/31/2019 FINDINGS: Cardiovascular: No significant vascular findings. Normal heart size. No pericardial effusion. Mediastinum/Nodes: No enlarged mediastinal, hilar, or axillary lymph nodes. Thyroid gland, trachea, and esophagus demonstrate no significant findings. Lungs/Pleura: Innumerable tiny centrilobular ground-glass pulmonary nodules throughout the lungs. Unchanged subpleural 7 mm nodule of the left lower lobe (series 3, image 87) and 6 mm nodule of the right lower lobe (series 3, image 83). Additional stable 5 mm fissural nodule of the right upper lobe (series 3, image 73) and 3 mm nodule of the right pulmonary apex (series 3, image 30). No pleural effusion or pneumothorax. Upper Abdomen: No acute abnormality. Cholelithiasis. Musculoskeletal: No chest wall mass or suspicious bone lesions identified. IMPRESSION: 1. Innumerable tiny centrilobular ground-glass pulmonary nodules throughout the lungs, likely representing smoking related respiratory bronchiolitis. 2. Unchanged bilateral pulmonary nodules measuring up to 7 mm. Additional future CT at 18-24 months (from initial baseline) is considered optional for low-risk patients, but is recommended for high-risk patients. This recommendation follows the consensus statement: Guidelines for Management of Incidental Pulmonary Nodules Detected on CT Images: From the Fleischner Society 2017; Radiology 2017; 284:228-243.) 3. Cholelithiasis. Electronically Signed   By: Lauralyn Primes M.D.   On: 01/13/2020 15:03    Assessment and plan- Patient is a 39 y.o. male who presents to pulmonary nodule clinic for follow-up of incidental lung nodules.  A telephone visit was conducted to review most recent CT scan results.    CT chest from 01/13/2020 revealed innumerable tiny central volar groundglass pulmonary nodules throughout the lungs, likely representing smoking-related respiratory bronchiolitis.  Unchanged bilateral pulmonary nodule measuring up to 7 mm in left lower lobe, 6 mm nodule of right lower lobe and stable 5 mm fissural nodule of the  right upper lobe and 3 mm nodule of the right pulmonary apex.  Radiologist recommends repeat CT chest without contrast 18 to 24 months since original scan.   Calculating malignancy probability of a pulmonary nodule: Risk factors include: 1.  Age. 2.  Cancer history. 3.  Diameter of pulmonary nodule and mm 4.  Location 5.  Smoking history 6.  Spiculation present   Based on risk factors, this patient is  moderate risk for the development of lung cancer.    I would recommend 1 additional scan to ensure stability between 18 to 24 months of original scan.  All nodules appear to be very stable from last imaging in October.    During our visit, we discussed pulmonary nodules are a common incidental finding and are often how lung cancer is discovered.  Lung cancer survival is directly related to the stage at diagnosis.  We discussed that nodules can vary in presentation from solitary pulmonary nodules to masses, 2 groundglass opacities and multiple nodules.  Pulmonary nodules in the majority of cases are benign but the probability of these becoming malignant cannot be undermined.  Early identification of malignant nodules could lead to early diagnosis and increased survival.   We discussed the probability of pulmonary nodules becoming malignant increase with age, pack years of tobacco use, size/characteristics of the nodule and location; with  upper lobe involvement being most worrisome.   We discussed the goal of our clinic is to thoroughly evaluate each nodule, developed a comprehensive, individualized plan of care utilizing the most advanced technology and significantly reduce the time from detection to treatment.  A dedicated pulmonary nodule clinic has proven to indeed expedite the detection and treatment of lung cancer.   Patient education in fact sheet provided along with most recent CT scans.  Disposition: CT chest without contrast to be completed in December 2021 approximately 18 to 24 months since original scan in May 2020. RTC in approximately 8 months for repeat evaluation and results from CT scan.   Visit Diagnosis 1. Lung nodules     Patient expressed understanding and was in agreement with this plan. He also understands that He can call clinic at any time with any questions, concerns, or complaints.   Greater than 50% was spent in counseling and coordination of care with this patient including but not limited to discussion of the relevant topics above (See A&P) including, but not limited to diagnosis and management of acute and chronic medical conditions.   Thank you for allowing me to participate in the care of this very pleasant patient.    Jacquelin Hawking, NP [place] [facility] Cell - [phone] Pager- [phone] 01/14/2020 11:56 AM

## 2020-03-31 ENCOUNTER — Telehealth: Payer: Self-pay | Admitting: *Deleted

## 2020-03-31 NOTE — Telephone Encounter (Signed)
Message left with pt's wife to review upcoming appts in the lung nodule clinic. Instructed to call back.

## 2020-04-27 ENCOUNTER — Telehealth: Payer: Self-pay | Admitting: General Practice

## 2020-04-27 NOTE — Telephone Encounter (Signed)
Individual has been contacted 3+ times regarding ED referral. Has been given the information regarding how to become a pt. Will not continue to attempt to contact pt regarding how to become a new pt.

## 2020-10-11 ENCOUNTER — Ambulatory Visit: Payer: Self-pay | Attending: Oncology

## 2020-10-12 ENCOUNTER — Telehealth: Payer: Self-pay | Admitting: Oncology

## 2020-10-12 ENCOUNTER — Encounter: Payer: Self-pay | Admitting: *Deleted

## 2020-10-12 NOTE — Progress Notes (Signed)
Pt was no show for CT scan on 12/13. Virtual visit with Boneta Lucks cancelled at this time. Letter mailed to patient to call back to reschedule appts.

## 2020-10-14 ENCOUNTER — Inpatient Hospital Stay: Payer: Self-pay | Admitting: Oncology

## 2020-10-15 ENCOUNTER — Other Ambulatory Visit: Payer: Self-pay

## 2020-10-18 ENCOUNTER — Telehealth: Payer: Self-pay | Admitting: Oncology

## 2020-12-08 ENCOUNTER — Encounter: Payer: Self-pay | Admitting: *Deleted

## 2020-12-08 NOTE — Progress Notes (Signed)
Attempted to contact pt to schedule follow up appts in the lung nodule clinic. Unable to contact pt to reschedule. Letter mailed previously for patient to call back to reschedule missed appts but have not heard from patient at this time. Pt will be discharged from clinic at this time but if calls back to reschedule appts, then will be happy to assist in rescheduling.   Pt does not have PCP at this time to inform of missed follow up.

## 2021-09-13 ENCOUNTER — Emergency Department: Payer: Self-pay

## 2021-09-13 ENCOUNTER — Emergency Department
Admission: EM | Admit: 2021-09-13 | Discharge: 2021-09-13 | Disposition: A | Discharge status: Home / Self Care | Payer: Self-pay | Attending: Emergency Medicine | Admitting: Emergency Medicine

## 2021-09-13 ENCOUNTER — Other Ambulatory Visit: Payer: Self-pay

## 2021-09-13 DIAGNOSIS — N202 Calculus of kidney with calculus of ureter: Secondary | ICD-10-CM | POA: Insufficient documentation

## 2021-09-13 DIAGNOSIS — N200 Calculus of kidney: Secondary | ICD-10-CM

## 2021-09-13 DIAGNOSIS — Z87891 Personal history of nicotine dependence: Secondary | ICD-10-CM | POA: Insufficient documentation

## 2021-09-13 LAB — CBC
HCT: 47.3 % (ref 39.0–52.0)
Hemoglobin: 15.8 g/dL (ref 13.0–17.0)
MCH: 29.4 pg (ref 26.0–34.0)
MCHC: 33.4 g/dL (ref 30.0–36.0)
MCV: 88.1 fL (ref 80.0–100.0)
Platelets: 243 10*3/uL (ref 150–400)
RBC: 5.37 MIL/uL (ref 4.22–5.81)
RDW: 13.2 % (ref 11.5–15.5)
WBC: 12.2 10*3/uL — ABNORMAL HIGH (ref 4.0–10.5)
nRBC: 0 % (ref 0.0–0.2)

## 2021-09-13 LAB — COMPREHENSIVE METABOLIC PANEL
ALT: 17 U/L (ref 0–44)
AST: 17 U/L (ref 15–41)
Albumin: 4.3 g/dL (ref 3.5–5.0)
Alkaline Phosphatase: 97 U/L (ref 38–126)
Anion gap: 5 (ref 5–15)
BUN: 18 mg/dL (ref 6–20)
CO2: 19 mmol/L — ABNORMAL LOW (ref 22–32)
Calcium: 8.9 mg/dL (ref 8.9–10.3)
Chloride: 115 mmol/L — ABNORMAL HIGH (ref 98–111)
Creatinine, Ser: 1.37 mg/dL — ABNORMAL HIGH (ref 0.61–1.24)
GFR, Estimated: >60 mL/min (ref >60)
Glucose, Bld: 104 mg/dL — ABNORMAL HIGH (ref 70–99)
Potassium: 3.7 mmol/L (ref 3.5–5.1)
Sodium: 139 mmol/L (ref 135–145)
Total Bilirubin: 0.6 mg/dL (ref 0.3–1.2)
Total Protein: 7.6 g/dL (ref 6.5–8.1)

## 2021-09-13 LAB — URINALYSIS, ROUTINE W REFLEX MICROSCOPIC
Bacteria, UA: NONE SEEN
Bilirubin Urine: NEGATIVE
Glucose, UA: NEGATIVE mg/dL
Ketones, ur: NEGATIVE mg/dL
Leukocytes,Ua: NEGATIVE
Nitrite: NEGATIVE
Protein, ur: 100 mg/dL — AB
RBC / HPF: >50 RBC/hpf — ABNORMAL HIGH (ref 0–5)
Specific Gravity, Urine: 1.027 (ref 1.005–1.030)
WBC, UA: >50 WBC/hpf — ABNORMAL HIGH (ref 0–5)
pH: 5 (ref 5.0–8.0)

## 2021-09-13 LAB — LIPASE, BLOOD: Lipase: 34 U/L (ref 11–51)

## 2021-09-13 MED ORDER — CEPHALEXIN 500 MG PO CAPS
500.0000 mg | ORAL_CAPSULE | Freq: Once | ORAL | Status: AC
  Administered 2021-09-13: 500 mg via ORAL
  Filled 2021-09-13: qty 1

## 2021-09-13 MED ORDER — ONDANSETRON 4 MG PO TBDP: 4.0000 mg | ORAL_TABLET | Freq: Three times a day (TID) | ORAL | 0 refills | Status: AC | PRN

## 2021-09-13 MED ORDER — OXYCODONE-ACETAMINOPHEN 5-325 MG PO TABS: 1.0000 | ORAL_TABLET | ORAL | 0 refills | Status: DC | PRN

## 2021-09-13 MED ORDER — CEPHALEXIN 500 MG PO CAPS: 500.0000 mg | ORAL_CAPSULE | Freq: Two times a day (BID) | ORAL | 0 refills | Status: AC

## 2021-09-13 MED ORDER — OXYCODONE-ACETAMINOPHEN 5-325 MG PO TABS
1.0000 | ORAL_TABLET | Freq: Once | ORAL | Status: AC
  Administered 2021-09-13: 1 via ORAL
  Filled 2021-09-13: qty 1

## 2021-09-13 MED ORDER — KETOROLAC TROMETHAMINE 30 MG/ML IJ SOLN
30.0000 mg | Freq: Once | INTRAMUSCULAR | Status: AC
  Administered 2021-09-13: 30 mg via INTRAMUSCULAR
  Filled 2021-09-13: qty 1

## 2021-09-13 NOTE — ED Triage Notes (Signed)
Pt here with left flank pain that started at 0600 today. Pt states pain is all over his back and radiates around his while abdomen, Pt states he has had a kidney stone in the past.

## 2021-09-13 NOTE — ED Provider Notes (Signed)
Emergency Medicine Provider Triage Evaluation Note  Eric Webster , a 40 y.o. male  was evaluated in triage.  Pt complains of left-sided flank pain that started at 3 AM.  Patient has had some nausea but no vomiting.  Has a history of nephrolithiasis and reports that symptoms feel similar.  No chest pain, chest tightness or abdominal pain.  Review of Systems  Positive: Patient has left sided flank pain  Negative: No chest pain, chest tightness or abdominal pain.  Physical Exam  BP (!) 151/91   Pulse 73   Temp 98.5 F (36.9 C) (Oral)   Resp 18   Ht 5\' 11"  (1.803 m)   Wt 95.3 kg   SpO2 97%   BMI 29.29 kg/m  Gen:   Awake, no distress   Resp:  Normal effort  MSK:   Moves extremities without difficulty  Other:    Medical Decision Making  Medically screening exam initiated at 3:35 PM.  Appropriate orders placed.  Webster was informed that the remainder of the evaluation will be completed by another provider, this initial triage assessment does not replace that evaluation, and the importance of remaining in the ED until their evaluation is complete.     Eric Rosenthal Singer, PA-C 09/13/21 1537    09/15/21, MD 09/13/21 1700

## 2021-09-13 NOTE — ED Provider Notes (Signed)
[facility] Emergency Department Provider Note   ____________________________________________   Event Date/Time   First MD Initiated Contact with Patient 09/13/21 2051     (approximate)  I have reviewed the triage vital signs and the nursing notes.   HISTORY  Chief Complaint Flank Pain    HPI KUNIO CUMMISKEY II is a 40 y.o. male with past medical history of kidney stones and episodic ataxia who presents to the ED complaining of flank pain.  Patient reports that he woke up this morning with sharp pain in his left flank radiating down towards the left lower quadrant of his abdomen.  Pain has been constant since then, waxing and waning in severity.  It does not seem to be exacerbated or alleviated by anything in particular.  He has occasionally felt nauseous and vomited when the pain is severe, but currently denies any nausea.  He has not had any dysuria and denies any fevers, does endorse noticing blood in his urine.  He has had kidney stones in the past and describes current symptoms as similar.  He has not take anything for his symptoms at home.        Past Medical History:  Diagnosis Date   Episodic ataxia (HCC)    Lung nodules    Renal disorder    kidney stones    Patient Active Problem List   Diagnosis Date Noted   Ruptured appendicitis 03/20/2019   Episodic ataxia (HCC) 01/08/2018    Past Surgical History:  Procedure Laterality Date   HAND SURGERY Right     Prior to Admission medications   Medication Sig Start Date End Date Taking? Authorizing Provider  ondansetron (ZOFRAN ODT) 4 MG disintegrating tablet Take 1 tablet (4 mg total) by mouth every 8 (eight) hours as needed for nausea or vomiting. 09/13/21  Yes Chesley Noon, MD  oxyCODONE-acetaminophen (PERCOCET) 5-325 MG tablet Take 1 tablet by mouth every 4 (four) hours as needed for severe pain. 09/13/21 09/13/22 Yes Chesley Noon, MD  acetaZOLAMIDE (DIAMOX) 250 MG tablet Take 250 mg  by mouth 2 (two) times a day. 02/07/19   [provider]  ibuprofen (ADVIL) 600 MG tablet Take 1 tablet (600 mg total) by mouth every 8 (eight) hours as needed. 10/04/19   Joni Reining, PA-C  lidocaine (XYLOCAINE) 2 % solution Use as directed 5 mLs in the mouth or throat every 6 (six) hours as needed for mouth pain. 10/04/19   Joni Reining, PA-C    Allergies Patient has no known allergies.  Family History  Problem Relation Age of Onset   COPD Mother    Lung cancer Father     Social History Social History   Tobacco Use   Smoking status: Former    Packs/day: 1.50    Years: 10.00    Pack years: 15.00    Types: Cigarettes   Smokeless tobacco: Never  Substance Use Topics   Alcohol use: Not Currently    Comment: 1 pint liquor every weekend   Drug use: No    Review of Systems  Constitutional: No fever/chills Eyes: No visual changes. ENT: No sore throat. Cardiovascular: Denies chest pain. Respiratory: Denies shortness of breath. Gastrointestinal: Positive for flank and abdominal pain.  No nausea, no vomiting.  No diarrhea.  No constipation. Genitourinary: Negative for dysuria.  Positive for hematuria. Musculoskeletal: Negative for back pain. Skin: Negative for rash. Neurological: Negative for headaches, focal weakness or numbness.  ____________________________________________   PHYSICAL EXAM:  VITAL  SIGNS: ED Triage Vitals  Enc Vitals Group     BP 09/13/21 1523 (!) 151/91     Pulse Rate 09/13/21 1523 73     Resp 09/13/21 1523 18     Temp 09/13/21 1523 98.5 F (36.9 C)     Temp Source 09/13/21 1523 Oral     SpO2 09/13/21 1523 97 %     Weight 09/13/21 1532 210 lb (95.3 kg)     Height 09/13/21 1532 5\' 11"  (1.803 m)     Head Circumference --      Peak Flow --      Pain Score 09/13/21 1531 10     Pain Loc --      Pain Edu? --      Excl. in [place]? --     Constitutional: Alert and oriented. Eyes: Conjunctivae are normal. Head: Atraumatic. Nose: No  congestion/rhinnorhea. Mouth/Throat: Mucous membranes are moist. Neck: Normal ROM Cardiovascular: Normal rate, regular rhythm. Grossly normal heart sounds.  2+ radial pulses bilaterally. Respiratory: Normal respiratory effort.  No retractions. Lungs CTAB. Gastrointestinal: Soft and nontender.  Left CVA tenderness noted.  No distention. Genitourinary: deferred Musculoskeletal: No lower extremity tenderness nor edema. Neurologic:  Normal speech and language. No gross focal neurologic deficits are appreciated. Skin:  Skin is warm, dry and intact. No rash noted. Psychiatric: Mood and affect are normal. Speech and behavior are normal.  ____________________________________________   LABS (all labs ordered are listed, but only abnormal results are displayed)  Labs Reviewed  URINALYSIS, ROUTINE W REFLEX MICROSCOPIC - Abnormal; Notable for the following components:      Result Value   Color, Urine RED (*)    APPearance CLOUDY (*)    Hgb urine dipstick LARGE (*)    Protein, ur 100 (*)    RBC / HPF >50 (*)    WBC, UA >50 (*)    All other components within normal limits  CBC - Abnormal; Notable for the following components:   WBC 12.2 (*)    All other components within normal limits  COMPREHENSIVE METABOLIC PANEL - Abnormal; Notable for the following components:   Chloride 115 (*)    CO2 19 (*)    Glucose, Bld 104 (*)    Creatinine, Ser 1.37 (*)    All other components within normal limits  URINE CULTURE  LIPASE, BLOOD    PROCEDURES  Procedure(s) performed (including Critical Care):  Procedures   ____________________________________________   INITIAL IMPRESSION / ASSESSMENT AND PLAN / ED COURSE      40 year old male with past medical history of kidney stones and episodic ataxia who presents to the ED complaining of constant pain in his left flank since this morning that has been waxing and waning in severity associated with hematuria and vomiting.  He has no abdominal  tenderness but pain is reproduced with palpation of his costovertebral area on the left.  CT scan performed from triage and is remarkable for 3 mm kidney stone in the distal left ureter.  Labs are unremarkable, however urine noted to have greater than 50 WBCs in addition to greater than 50 RBCs.  There are no leukocytes or bacteria noted in his urine, findings discussed with Dr. Diamantina Providence of urology due to concern for infection.  He agrees with plan to send urine for culture and treat as an outpatient with Keflex.  His office will be in contact with patient to schedule follow-up appointment in the next couple of days.  Patient's pain is well controlled following  dose of Toradol and Percocet, he is appropriate for outpatient management and we will prescribe additional pain medication along with nausea medication.  He was counseled to return to the ED for new worsening symptoms, patient agrees with plan.      ____________________________________________   FINAL CLINICAL IMPRESSION(S) / ED DIAGNOSES  Final diagnoses:  Kidney stone     ED Discharge Orders          Ordered    oxyCODONE-acetaminophen (PERCOCET) 5-325 MG tablet  Every 4 hours PRN        09/13/21 2111    ondansetron (ZOFRAN ODT) 4 MG disintegrating tablet  Every 8 hours PRN        09/13/21 2111             Note:  This document was prepared using Dragon voice recognition software and may include unintentional dictation errors.    Blake Divine, MD 09/13/21 2112

## 2021-09-15 ENCOUNTER — Telehealth: Payer: Self-pay | Admitting: Urology

## 2021-09-15 LAB — URINE CULTURE

## 2021-09-15 NOTE — Telephone Encounter (Signed)
LMOM for pt to call office per Sninsky's message:  follow-up with me in 1 to 2 weeks to confirm stone passage, UA prior

## 2022-02-15 ENCOUNTER — Emergency Department
Admission: EM | Admit: 2022-02-15 | Discharge: 2022-02-15 | Disposition: A | Discharge status: Home / Self Care | Payer: Self-pay | Attending: Emergency Medicine | Admitting: Emergency Medicine

## 2022-02-15 ENCOUNTER — Emergency Department: Payer: Self-pay

## 2022-02-15 ENCOUNTER — Encounter: Payer: Self-pay | Admitting: Emergency Medicine

## 2022-02-15 ENCOUNTER — Other Ambulatory Visit: Payer: Self-pay

## 2022-02-15 DIAGNOSIS — N200 Calculus of kidney: Secondary | ICD-10-CM | POA: Insufficient documentation

## 2022-02-15 DIAGNOSIS — R109 Unspecified abdominal pain: Secondary | ICD-10-CM

## 2022-02-15 DIAGNOSIS — D72829 Elevated white blood cell count, unspecified: Secondary | ICD-10-CM | POA: Insufficient documentation

## 2022-02-15 DIAGNOSIS — E876 Hypokalemia: Secondary | ICD-10-CM | POA: Insufficient documentation

## 2022-02-15 LAB — BASIC METABOLIC PANEL
Anion gap: 7 (ref 5–15)
BUN: 17 mg/dL (ref 6–20)
CO2: 17 mmol/L — ABNORMAL LOW (ref 22–32)
Calcium: 8.7 mg/dL — ABNORMAL LOW (ref 8.9–10.3)
Chloride: 113 mmol/L — ABNORMAL HIGH (ref 98–111)
Creatinine, Ser: 1.62 mg/dL — ABNORMAL HIGH (ref 0.61–1.24)
GFR, Estimated: 54 mL/min — ABNORMAL LOW (ref >60)
Glucose, Bld: 125 mg/dL — ABNORMAL HIGH (ref 70–99)
Potassium: 3.2 mmol/L — ABNORMAL LOW (ref 3.5–5.1)
Sodium: 137 mmol/L (ref 135–145)

## 2022-02-15 LAB — CBC
HCT: 47.1 % (ref 39.0–52.0)
Hemoglobin: 15.6 g/dL (ref 13.0–17.0)
MCH: 29.2 pg (ref 26.0–34.0)
MCHC: 33.1 g/dL (ref 30.0–36.0)
MCV: 88.2 fL (ref 80.0–100.0)
Platelets: 204 10*3/uL (ref 150–400)
RBC: 5.34 MIL/uL (ref 4.22–5.81)
RDW: 12.9 % (ref 11.5–15.5)
WBC: 13.8 10*3/uL — ABNORMAL HIGH (ref 4.0–10.5)
nRBC: 0 % (ref 0.0–0.2)

## 2022-02-15 LAB — URINALYSIS, ROUTINE W REFLEX MICROSCOPIC
Bacteria, UA: NONE SEEN
Bilirubin Urine: NEGATIVE
Glucose, UA: NEGATIVE mg/dL
Ketones, ur: NEGATIVE mg/dL
Leukocytes,Ua: NEGATIVE
Nitrite: NEGATIVE
Protein, ur: 30 mg/dL — AB
RBC / HPF: >50 RBC/hpf — ABNORMAL HIGH (ref 0–5)
Specific Gravity, Urine: 1.02 (ref 1.005–1.030)
pH: 5 (ref 5.0–8.0)

## 2022-02-15 MED ORDER — SODIUM CHLORIDE 0.9 % IV BOLUS
1000.0000 mL | Freq: Once | INTRAVENOUS | Status: AC
  Administered 2022-02-15: 1000 mL via INTRAVENOUS

## 2022-02-15 MED ORDER — KETOROLAC TROMETHAMINE 30 MG/ML IJ SOLN
30.0000 mg | Freq: Once | INTRAMUSCULAR | Status: AC
  Administered 2022-02-15: 30 mg via INTRAMUSCULAR
  Filled 2022-02-15: qty 1

## 2022-02-15 MED ORDER — ONDANSETRON HCL 4 MG/2ML IJ SOLN
4.0000 mg | Freq: Once | INTRAMUSCULAR | Status: AC
  Administered 2022-02-15: 4 mg via INTRAVENOUS
  Filled 2022-02-15: qty 2

## 2022-02-15 MED ORDER — TAMSULOSIN HCL 0.4 MG PO CAPS
0.4000 mg | ORAL_CAPSULE | Freq: Every day | ORAL | 0 refills | Status: AC
Start: 2022-02-15 — End: ?

## 2022-02-15 MED ORDER — TAMSULOSIN HCL 0.4 MG PO CAPS
0.4000 mg | ORAL_CAPSULE | Freq: Once | ORAL | Status: AC
  Administered 2022-02-15: 0.4 mg via ORAL
  Filled 2022-02-15: qty 1

## 2022-02-15 MED ORDER — OXYCODONE-ACETAMINOPHEN 5-325 MG PO TABS: 1.0000 | ORAL_TABLET | ORAL | 0 refills | Status: AC | PRN

## 2022-02-15 MED ORDER — KETOROLAC TROMETHAMINE 10 MG PO TABS
10.0000 mg | ORAL_TABLET | Freq: Four times a day (QID) | ORAL | 0 refills | Status: AC | PRN
Start: 2022-02-15 — End: ?

## 2022-02-15 MED ORDER — MORPHINE SULFATE (PF) 4 MG/ML IV SOLN
4.0000 mg | Freq: Once | INTRAVENOUS | Status: AC
  Administered 2022-02-15: 4 mg via INTRAVENOUS
  Filled 2022-02-15: qty 1

## 2022-02-15 NOTE — ED Provider Notes (Signed)
? ?[facility] ?Provider Note ? ? ? Event Date/Time  ? First MD Initiated Contact with Patient 02/15/22 1735   ?  (approximate) ? ? ?History  ? ?Flank Pain ? ? ?HPI ? ?Eric Webster is a 41 y.o. male with history of ruptured appendicitis and kidney stones presents emergency department complaining of right-sided flank pain.  He denies any fever or chills.  States his urine is darker than normal.  Vomiting or diarrhea.  Symptoms have been ongoing for 2 days. ? ?  ? ? ?Physical Exam  ? ?Triage Vital Signs: ?ED Triage Vitals  ?Enc Vitals Group  ?   BP 02/15/22 1702 (!) 132/94  ?   Pulse Rate 02/15/22 1702 79  ?   Resp 02/15/22 1702 17  ?   Temp 02/15/22 1702 99 ?F (37.2 ?C)  ?   Temp Source 02/15/22 1702 Oral  ?   SpO2 02/15/22 1702 95 %  ?   Weight 02/15/22 1658 210 lb 1.6 oz (95.3 kg)  ?   Height 02/15/22 1658 5\' 11"  (1.803 m)  ?   Head Circumference --   ?   Peak Flow --   ?   Pain Score 02/15/22 1658 10  ?   Pain Loc --   ?   Pain Edu? --   ?   Excl. in [place]? --   ? ? ?Most recent vital signs: ?Vitals:  ? 02/15/22 1702  ?BP: (!) 132/94  ?Pulse: 79  ?Resp: 17  ?Temp: 99 ?F (37.2 ?C)  ?SpO2: 95%  ? ? ? ?General: Awake, no distress.   ?CV:  Good peripheral perfusion. regular rate and  rhythm ?Resp:  Normal effort. Lungs CTA ?Abd:  No distention.  Abdomen is nontender, positive CVA tenderness on the right ?Other:    ? ? ?ED Results / Procedures / Treatments  ? ?Labs ?(all labs ordered are listed, but only abnormal results are displayed) ?Labs Reviewed  ?URINALYSIS, ROUTINE W REFLEX MICROSCOPIC - Abnormal; Notable for the following components:  ?    Result Value  ? Color, Urine YELLOW (*)   ? APPearance HAZY (*)   ? Hgb urine dipstick LARGE (*)   ? Protein, ur 30 (*)   ? RBC / HPF >50 (*)   ? All other components within normal limits  ?BASIC METABOLIC PANEL - Abnormal; Notable for the following components:  ? Potassium 3.2 (*)   ? Chloride 113 (*)   ? CO2 17 (*)   ? Glucose, Bld 125 (*)   ?  Creatinine, Ser 1.62 (*)   ? Calcium 8.7 (*)   ? GFR, Estimated 54 (*)   ? All other components within normal limits  ?CBC - Abnormal; Notable for the following components:  ? WBC 13.8 (*)   ? All other components within normal limits  ? ? ? ?EKG ? ? ? ? ?RADIOLOGY ?CT renal stone ? ? ? ?PROCEDURES: ? ? ?Procedures ? ? ?MEDICATIONS ORDERED IN ED: ?Medications  ?ketorolac (TORADOL) 30 MG/ML injection 30 mg (has no administration in time range)  ?tamsulosin (FLOMAX) capsule 0.4 mg (has no administration in time range)  ?sodium chloride 0.9 % bolus 1,000 mL (0 mLs Intravenous Stopped 02/15/22 1908)  ?morphine (PF) 4 MG/ML injection 4 mg (4 mg Intravenous Given 02/15/22 1809)  ?ondansetron [facility]) injection 4 mg (4 mg Intravenous Given 02/15/22 1808)  ? ? ? ?IMPRESSION / MDM / ASSESSMENT AND PLAN / ED COURSE  ?I reviewed the  triage vital signs and the nursing notes. ?             ?               ? ?Differential diagnosis includes, but is not limited to, kidney stone, infected kidney stone, colitis, pyelonephritis, UTI ? ?His WBC is elevated at [id number], basic metabolic panel has several abnormalities with a decreased potassium of 3.2, elevated chloride 113, CO2 17, glucose 125, BUN is normal, creatinine is elevated 1.62 calcium is decreased and his GFR is decreased.  Feel that patient is probably more dehydrated and needs fluids especially if this is a kidney stone. ? ?Placed patient on normal saline 1 L IV, Zofran 4 mg IV, morphine 4 mg IV.  If the kidney stone is close to the ureter we will place the patient on Toradol. ? ?CT renal stone study was independently reviewed by me.  Radiologist states fat stranding in most likely recently passed kidney stone. ? ?I did explain the findings to the patient.  He is continue to have some flank pain.  We will give him Toradol 30 mg IV.  He will be discharged with prescription for Toradol, Flomax, and Percocet.  He is to follow-up with urology if not improving in 3 days.  Return  emergency department worsening.  He was given a work note.  He is in agreement with treatment plan.  He was discharged in stable condition in the care of his wife. ? ? ?  ? ? ?FINAL CLINICAL IMPRESSION(S) / ED DIAGNOSES  ? ?Final diagnoses:  ?Kidney stone  ?Flank pain  ? ? ? ?Rx / DC Orders  ? ?ED Discharge Orders   ? ?      Ordered  ?  oxyCODONE-acetaminophen (PERCOCET) 5-325 MG tablet  Every 4 hours PRN       ? 02/15/22 1959  ?  ketorolac (TORADOL) 10 MG tablet  Every 6 hours PRN       ?Note to Pharmacy: Loading dose given  ? 02/15/22 1959  ?  tamsulosin (FLOMAX) 0.4 MG CAPS capsule  Daily       ? 02/15/22 1959  ? ?  ?  ? ?  ? ? ? ?Note:  This document was prepared using Dragon voice recognition software and may include unintentional dictation errors. ? ?  ?Versie Starks, PA-C ?02/15/22 2002 ? ?  ?Blake Divine, MD ?02/15/22 2337 ? ?

## 2022-02-15 NOTE — Discharge Instructions (Signed)
Follow-up with your regular doctor as needed.  Follow-up with urology if not improving in 3 to 4 days.  If you do not have a urologist he can follow-up with Dr. Erlene Quan.  Please call for an appointment.  Take your medication as prescribed.  Return if worsening. ?

## 2022-02-15 NOTE — ED Triage Notes (Signed)
Pt comes into the ED via POV c/o right side flank pain that has been intermittent for the past couple days until today.  Pt states the pain got a lot worse today.  Pt has a h/o kidney stones.  Pt in NAD at this time with even and unlabored respirations.  ?

## 2022-02-15 NOTE — ED Notes (Signed)
First Nurse Note:  Pt to ED via POV for flank pain. Pt states that he has hx/o kidney stones. Pt is in NAD. ?

## 2022-04-25 ENCOUNTER — Emergency Department
Admission: EM | Admit: 2022-04-25 | Discharge: 2022-04-25 | Disposition: A | Discharge status: Home / Self Care | Payer: Self-pay | Attending: Emergency Medicine | Admitting: Emergency Medicine

## 2022-04-25 ENCOUNTER — Other Ambulatory Visit: Payer: Self-pay

## 2022-04-25 ENCOUNTER — Emergency Department: Payer: Self-pay

## 2022-04-25 DIAGNOSIS — M47812 Spondylosis without myelopathy or radiculopathy, cervical region: Secondary | ICD-10-CM | POA: Insufficient documentation

## 2022-04-25 DIAGNOSIS — G5603 Carpal tunnel syndrome, bilateral upper limbs: Secondary | ICD-10-CM | POA: Insufficient documentation

## 2022-04-25 LAB — DIFFERENTIAL
Abs Immature Granulocytes: 0.02 10*3/uL (ref 0.00–0.07)
Basophils Absolute: 0.1 10*3/uL (ref 0.0–0.1)
Basophils Relative: 1 %
Eosinophils Absolute: 0.2 10*3/uL (ref 0.0–0.5)
Eosinophils Relative: 2 %
Immature Granulocytes: 0 %
Lymphocytes Relative: 32 %
Lymphs Abs: 2.9 10*3/uL (ref 0.7–4.0)
Monocytes Absolute: 0.6 10*3/uL (ref 0.1–1.0)
Monocytes Relative: 7 %
Neutro Abs: 5.3 10*3/uL (ref 1.7–7.7)
Neutrophils Relative %: 58 %

## 2022-04-25 LAB — COMPREHENSIVE METABOLIC PANEL
ALT: 20 U/L (ref 0–44)
AST: 17 U/L (ref 15–41)
Albumin: 4.2 g/dL (ref 3.5–5.0)
Alkaline Phosphatase: 78 U/L (ref 38–126)
Anion gap: 3 — ABNORMAL LOW (ref 5–15)
BUN: 13 mg/dL (ref 6–20)
CO2: 21 mmol/L — ABNORMAL LOW (ref 22–32)
Calcium: 8.9 mg/dL (ref 8.9–10.3)
Chloride: 117 mmol/L — ABNORMAL HIGH (ref 98–111)
Creatinine, Ser: 1.05 mg/dL (ref 0.61–1.24)
GFR, Estimated: >60 mL/min (ref >60)
Glucose, Bld: 90 mg/dL (ref 70–99)
Potassium: 3.4 mmol/L — ABNORMAL LOW (ref 3.5–5.1)
Sodium: 141 mmol/L (ref 135–145)
Total Bilirubin: 0.7 mg/dL (ref 0.3–1.2)
Total Protein: 7.7 g/dL (ref 6.5–8.1)

## 2022-04-25 LAB — CBG MONITORING, ED: Glucose-Capillary: 115 mg/dL — ABNORMAL HIGH (ref 70–99)

## 2022-04-25 LAB — CBC
HCT: 48.9 % (ref 39.0–52.0)
Hemoglobin: 16 g/dL (ref 13.0–17.0)
MCH: 29.4 pg (ref 26.0–34.0)
MCHC: 32.7 g/dL (ref 30.0–36.0)
MCV: 89.9 fL (ref 80.0–100.0)
Platelets: 231 10*3/uL (ref 150–400)
RBC: 5.44 MIL/uL (ref 4.22–5.81)
RDW: 13.5 % (ref 11.5–15.5)
WBC: 9.1 10*3/uL (ref 4.0–10.5)
nRBC: 0 % (ref 0.0–0.2)

## 2022-04-25 LAB — APTT: aPTT: 29 seconds (ref 24–36)

## 2022-04-25 LAB — PROTIME-INR
INR: 1 (ref 0.8–1.2)
Prothrombin Time: 13.1 seconds (ref 11.4–15.2)

## 2022-04-25 MED ORDER — MELOXICAM 7.5 MG PO TABS
15.0000 mg | ORAL_TABLET | Freq: Once | ORAL | Status: AC
  Administered 2022-04-25: 15 mg via ORAL
  Filled 2022-04-25: qty 2

## 2022-04-25 MED ORDER — SODIUM CHLORIDE 0.9% FLUSH
3.0000 mL | Freq: Once | INTRAVENOUS | Status: AC
  Administered 2022-04-25: 3 mL via INTRAVENOUS

## 2022-04-25 MED ORDER — MELOXICAM 15 MG PO TABS: 15.0000 mg | ORAL_TABLET | Freq: Every day | ORAL | 0 refills | Status: AC

## 2022-04-25 NOTE — ED Triage Notes (Signed)
Pt comes with c/o bilateral hand pain for over a week. Pt states they feel numb and tingling. Pt states he doesn't have much of any hand grip. Pt denies any CP, SOB, dizziness, N/V/D or weakness.

## 2022-08-23 IMAGING — CT CT RENAL STONE PROTOCOL
2 of 4 series · 15 of 46 positions shown, 17 images · non-contrast
Comparison: CT abdomen and pelvis dated September 13, 2021

CLINICAL DATA: Flank pain



[Series 2: stone full standard · axial · 0.86mm/px · z∈[-558,-64]mm · 12 of 114 slices shown, 14 images]
[im 10/114  soft-tissue]
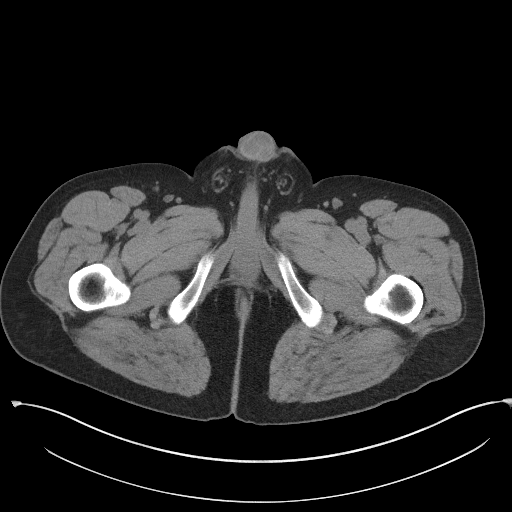
[im 10/114  bone]
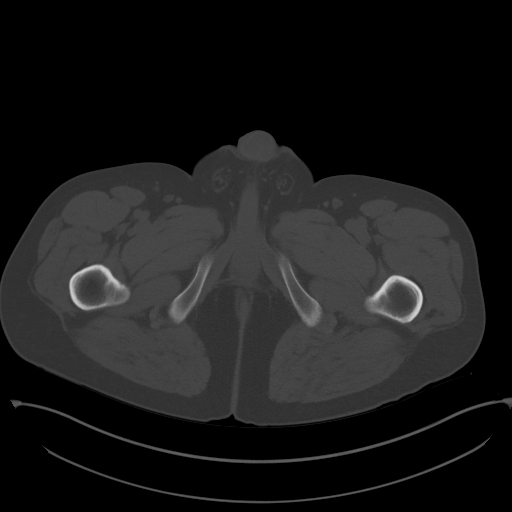
[im 19/114  soft-tissue]
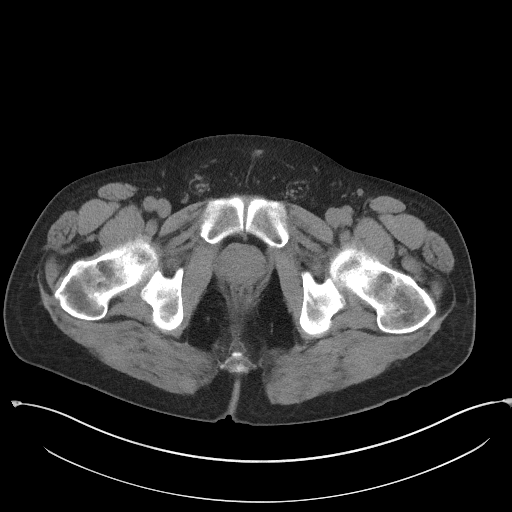
[im 28/114  soft-tissue]
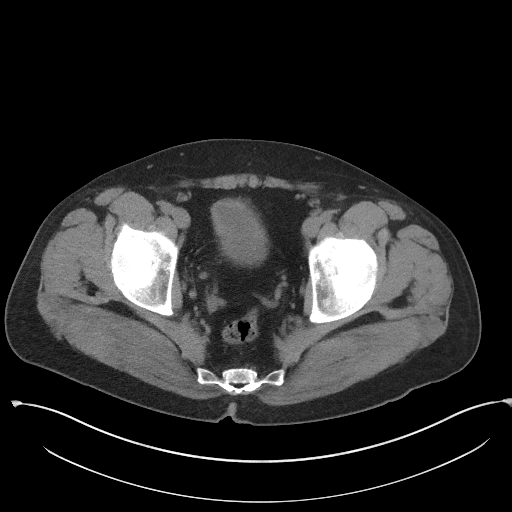
[im 37/114  soft-tissue]
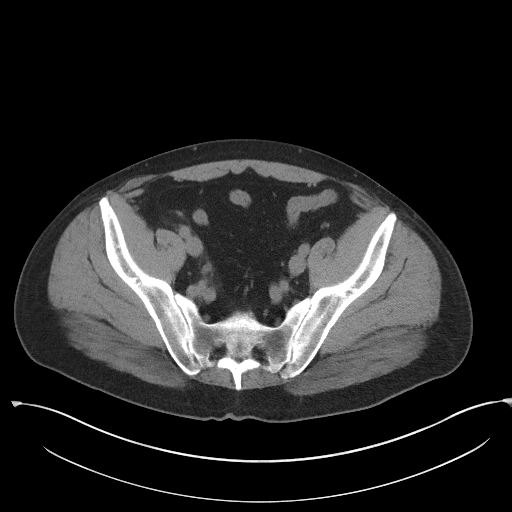
[im 46/114  soft-tissue]
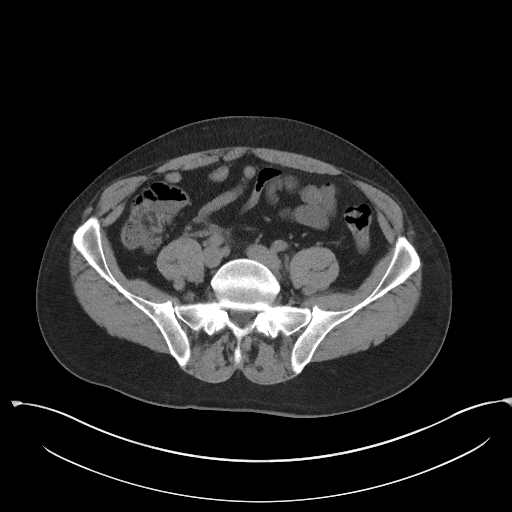
[im 55/114  soft-tissue]
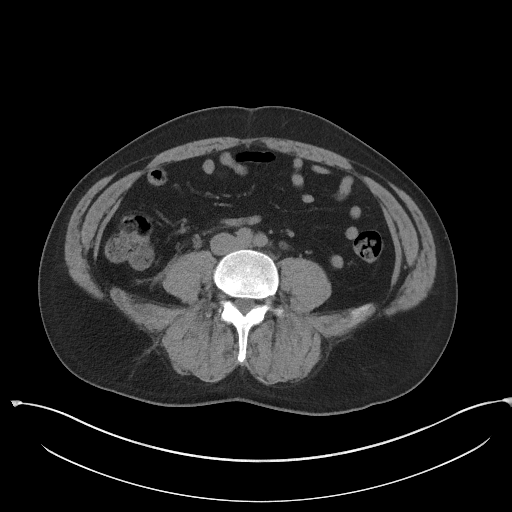
[im 64/114  soft-tissue]
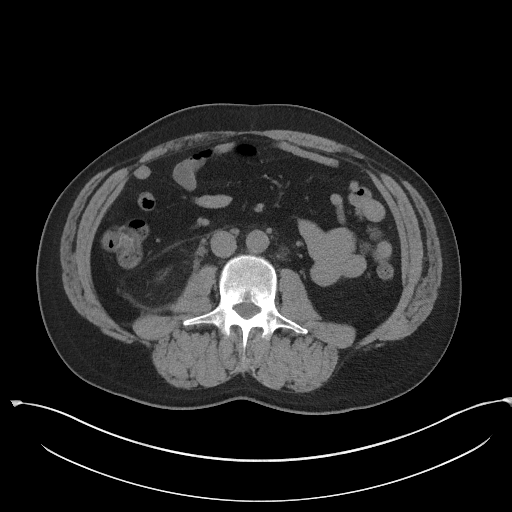
[im 73/114  soft-tissue]
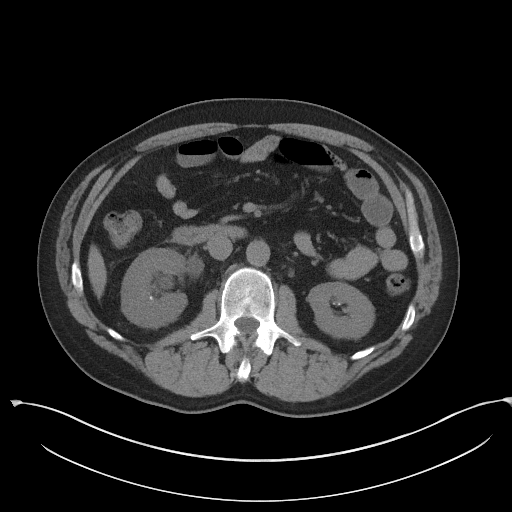
[im 82/114  soft-tissue]
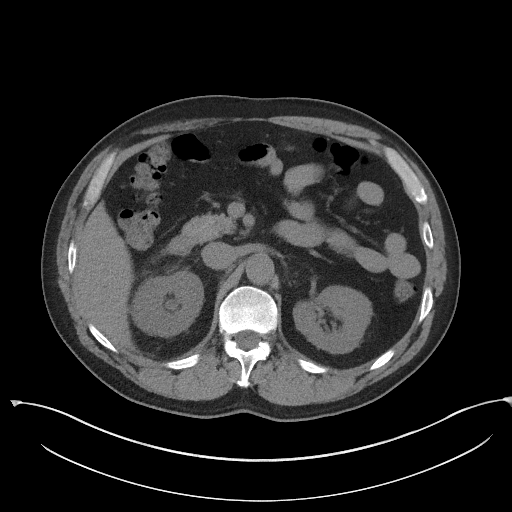
[im 82/114  bone]
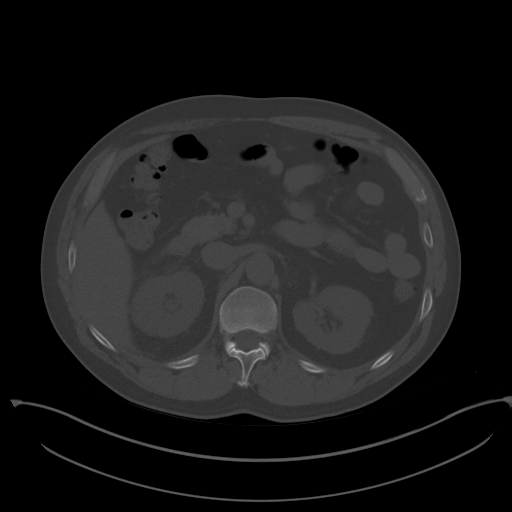
[im 91/114  soft-tissue]
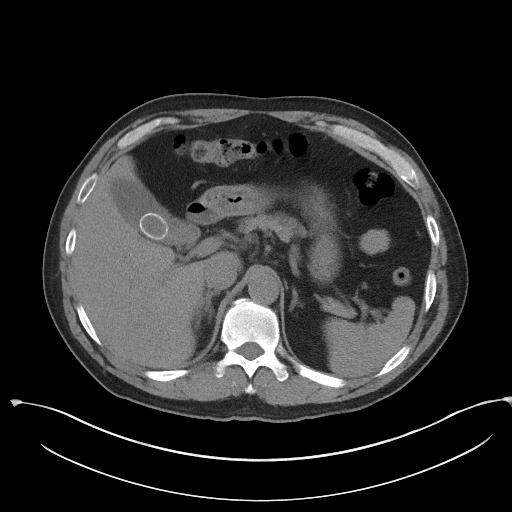
[im 100/114  soft-tissue]
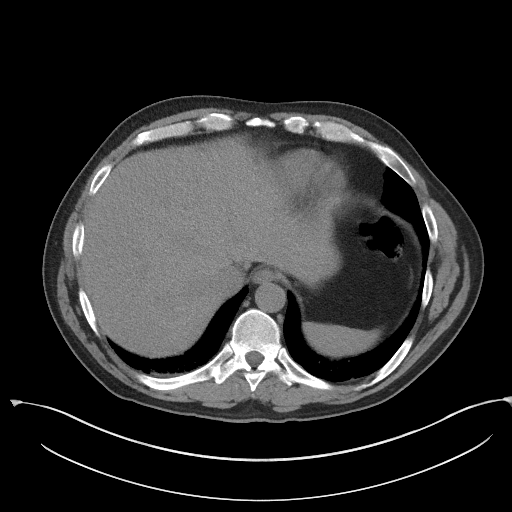
[im 109/114  soft-tissue]
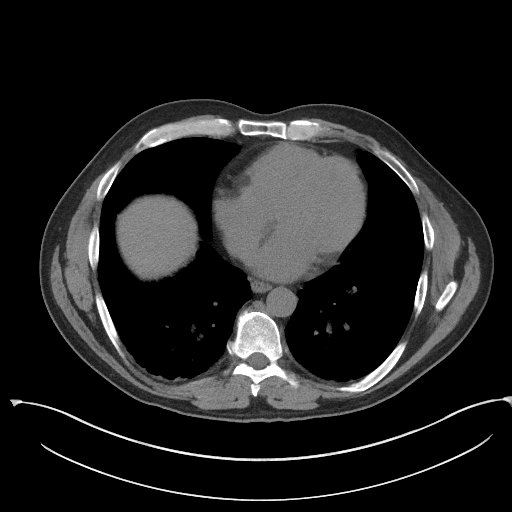

[Series 5: coronal · coronal · 0.83mm/px · 3 of 132 slices shown]
[im 44/132  soft-tissue]
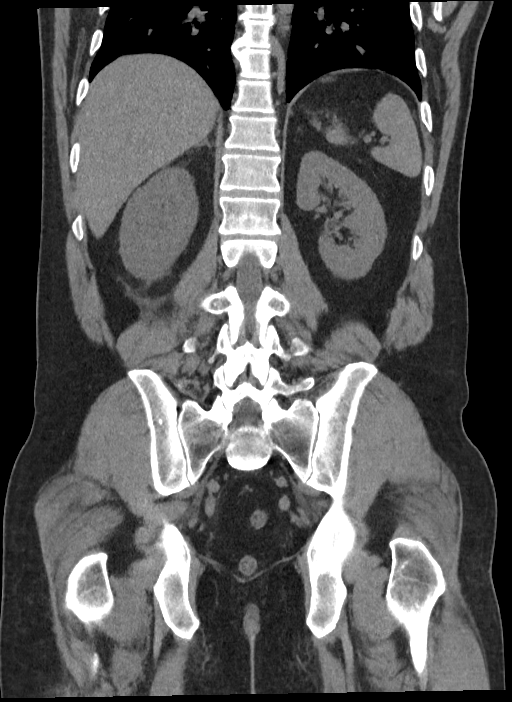
[im 59/132  soft-tissue]
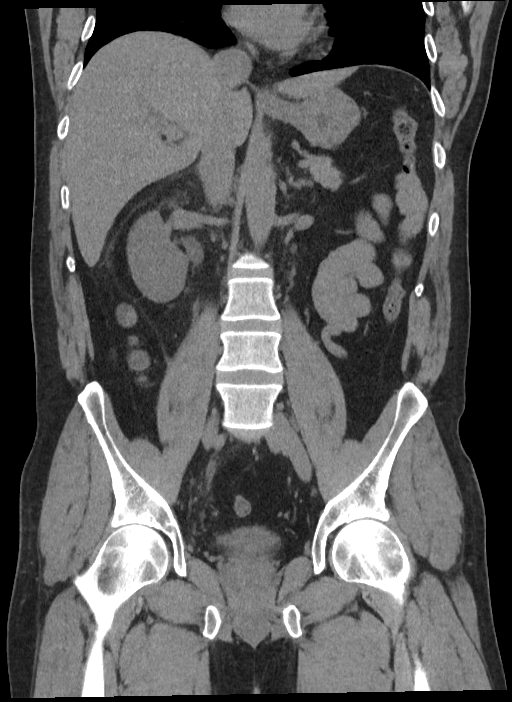
[im 73/132  soft-tissue]
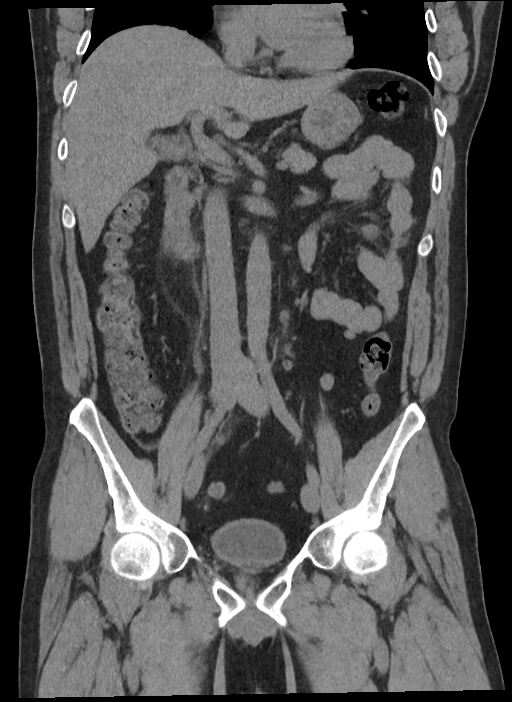

[15 of 46 positions shown; findings below may reference images not displayed]

FINDINGS: Lower chest: No acute abnormality.

Hepatobiliary: No suspicious focal liver lesions. Cholelithiasis
with no gallbladder wall thickening. No biliary ductal dilation.

Pancreas: Unremarkable. No pancreatic ductal dilatation or
surrounding inflammatory changes.

Spleen: Normal in size without focal abnormality.

Adrenals/Urinary Tract: Bilateral adrenal glands are unremarkable.
Moderate right hydronephrosis and right perinephric/periureter fat
stranding. A 4 mm stone is seen within the urinary bladder. No left
hydronephrosis. Bilateral nonobstructing renal stones are seen.

Stomach/Bowel: Stomach is within normal limits. Appendix is not
visualized, although there are no secondary findings of acute
appendicitis. No evidence of bowel wall thickening, distention, or
inflammatory changes.

Vascular/Lymphatic: Minimal aortic atherosclerosis. No enlarged
abdominal or pelvic lymph nodes.

Reproductive: Prostate is unremarkable.

Other: No abdominal wall hernia or abnormality. No abdominopelvic
ascites.

Musculoskeletal: No acute or significant osseous findings.
IMPRESSION: 1. Moderate right hydronephrosisand perinephric/periureter fat
stranding. A 4 mm stone is seen in the bladder near the right ureter
vesicular junction. Findings are favored to be due to recently
passed stone.
2. Bilateral nonobstructing renal stones.
3. Mild aortic Atherosclerosis ([id number]).

## 2023-09-17 ENCOUNTER — Encounter: Payer: Self-pay | Admitting: Emergency Medicine

## 2023-09-17 ENCOUNTER — Other Ambulatory Visit: Payer: Self-pay

## 2023-09-17 ENCOUNTER — Emergency Department
Admission: EM | Admit: 2023-09-17 | Discharge: 2023-09-17 | Disposition: A | Discharge status: Home / Self Care | Payer: Self-pay | Attending: Emergency Medicine | Admitting: Emergency Medicine

## 2023-09-17 ENCOUNTER — Emergency Department: Payer: Self-pay

## 2023-09-17 DIAGNOSIS — J40 Bronchitis, not specified as acute or chronic: Secondary | ICD-10-CM

## 2023-09-17 DIAGNOSIS — E876 Hypokalemia: Secondary | ICD-10-CM | POA: Insufficient documentation

## 2023-09-17 DIAGNOSIS — Z1152 Encounter for screening for COVID-19: Secondary | ICD-10-CM | POA: Insufficient documentation

## 2023-09-17 DIAGNOSIS — D72829 Elevated white blood cell count, unspecified: Secondary | ICD-10-CM | POA: Insufficient documentation

## 2023-09-17 DIAGNOSIS — H109 Unspecified conjunctivitis: Secondary | ICD-10-CM | POA: Insufficient documentation

## 2023-09-17 DIAGNOSIS — J209 Acute bronchitis, unspecified: Secondary | ICD-10-CM | POA: Insufficient documentation

## 2023-09-17 DIAGNOSIS — J069 Acute upper respiratory infection, unspecified: Secondary | ICD-10-CM | POA: Insufficient documentation

## 2023-09-17 LAB — CBC WITH DIFFERENTIAL/PLATELET
Abs Immature Granulocytes: 0.06 10*3/uL (ref 0.00–0.07)
Basophils Absolute: 0.1 10*3/uL (ref 0.0–0.1)
Basophils Relative: 1 %
Eosinophils Absolute: 0.2 10*3/uL (ref 0.0–0.5)
Eosinophils Relative: 2 %
HCT: 45 % (ref 39.0–52.0)
Hemoglobin: 15.3 g/dL (ref 13.0–17.0)
Immature Granulocytes: 1 %
Lymphocytes Relative: 23 %
Lymphs Abs: 3 10*3/uL (ref 0.7–4.0)
MCH: 30.1 pg (ref 26.0–34.0)
MCHC: 34 g/dL (ref 30.0–36.0)
MCV: 88.4 fL (ref 80.0–100.0)
Monocytes Absolute: 0.8 10*3/uL (ref 0.1–1.0)
Monocytes Relative: 6 %
Neutro Abs: 8.9 10*3/uL — ABNORMAL HIGH (ref 1.7–7.7)
Neutrophils Relative %: 67 %
Platelets: 293 10*3/uL (ref 150–400)
RBC: 5.09 MIL/uL (ref 4.22–5.81)
RDW: 12.4 % (ref 11.5–15.5)
WBC: 13 10*3/uL — ABNORMAL HIGH (ref 4.0–10.5)
nRBC: 0 % (ref 0.0–0.2)

## 2023-09-17 LAB — BASIC METABOLIC PANEL
Anion gap: 9 (ref 5–15)
BUN: 14 mg/dL (ref 6–20)
CO2: 21 mmol/L — ABNORMAL LOW (ref 22–32)
Calcium: 9.3 mg/dL (ref 8.9–10.3)
Chloride: 110 mmol/L (ref 98–111)
Creatinine, Ser: 0.93 mg/dL (ref 0.61–1.24)
GFR, Estimated: >60 mL/min (ref >60)
Glucose, Bld: 130 mg/dL — ABNORMAL HIGH (ref 70–99)
Potassium: 2.9 mmol/L — ABNORMAL LOW (ref 3.5–5.1)
Sodium: 140 mmol/L (ref 135–145)

## 2023-09-17 LAB — RESP PANEL BY RT-PCR (RSV, FLU A&B, COVID)  RVPGX2
Influenza A by PCR: NEGATIVE
Influenza B by PCR: NEGATIVE
Resp Syncytial Virus by PCR: NEGATIVE
SARS Coronavirus 2 by RT PCR: NEGATIVE

## 2023-09-17 MED ORDER — AZITHROMYCIN 500 MG PO TABS
500.0000 mg | ORAL_TABLET | Freq: Once | ORAL | Status: AC
  Administered 2023-09-17: 500 mg via ORAL
  Filled 2023-09-17: qty 1

## 2023-09-17 MED ORDER — AZITHROMYCIN 250 MG PO TABS: ORAL_TABLET | ORAL | 0 refills | Status: AC

## 2023-09-17 MED ORDER — OFLOXACIN 0.3 % OP SOLN: 1.0000 [drp] | Freq: Four times a day (QID) | OPHTHALMIC | 0 refills | Status: AC

## 2023-09-17 MED ORDER — OFLOXACIN 0.3 % OP SOLN
2.0000 [drp] | Freq: Four times a day (QID) | OPHTHALMIC | Status: DC
  Filled 2023-09-17: qty 5

## 2023-09-17 MED ORDER — AZITHROMYCIN 250 MG PO TABS: ORAL_TABLET | ORAL | 0 refills | Status: DC

## 2023-09-17 MED ORDER — OFLOXACIN 0.3 % OP SOLN: 1.0000 [drp] | Freq: Four times a day (QID) | OPHTHALMIC | 0 refills | Status: DC

## 2023-09-17 MED ORDER — OFLOXACIN 0.3 % OP SOLN
2.0000 [drp] | Freq: Four times a day (QID) | OPHTHALMIC | Status: DC
  Administered 2023-09-17: 2 [drp] via OPHTHALMIC
  Filled 2023-09-17: qty 5

## 2023-09-17 NOTE — ED Provider Notes (Signed)
[facility] Provider Note    Event Date/Time   First MD Initiated Contact with Patient 09/17/23 2021     (approximate)  History   Chief Complaint: Cough  HPI  Eric Webster is a 42 y.o. male who presents to the emergency department for 1 week of cough congestion.  According to the patient for the past 7 or 8 days he has been coughing with congestion initially with a low-grade fever.  He more recently states now he has noted some conjunctival injection of both eyes with a mild amount of discharge and is worried that he might have pinkeye.  Patient states he missed work today due to the cough so he came to the emergency department for evaluation.  Physical Exam   Triage Vital Signs: ED Triage Vitals  Encounter Vitals Group     BP 09/17/23 1807 (!) 140/92     Systolic BP Percentile --      Diastolic BP Percentile --      Pulse Rate 09/17/23 1807 74     Resp 09/17/23 1807 20     Temp 09/17/23 1807 98.1 F (36.7 C)     Temp Source 09/17/23 1807 Oral     SpO2 09/17/23 1807 97 %     Weight --      Height --      Head Circumference --      Peak Flow --      Pain Score 09/17/23 1809 8     Pain Loc --      Pain Education --      Exclude from Growth Chart --     Most recent vital signs: Vitals:   09/17/23 1807  BP: (!) 140/92  Pulse: 74  Resp: 20  Temp: 98.1 F (36.7 C)  SpO2: 97%    General: Awake, no distress.  Patient does have mild scleral injection bilaterally with mild amount of discharge bilaterally. CV:  Good peripheral perfusion.  Regular rate and rhythm  Resp:  Normal effort.  Equal breath sounds bilaterally.  No wheeze rales or rhonchi.  Occasional cough during exam. Abd:  No distention.  Soft, nontender.  No rebound or guarding.  ED Results / Procedures / Treatments   RADIOLOGY  I have reviewed and interpreted chest x-ray images.  No consolidation on my evaluation. Radiology is read the x-ray is negative   MEDICATIONS  ORDERED IN ED: Medications  ofloxacin (OCUFLOX) 0.3 % ophthalmic solution 2 drop (2 drops Both Eyes Given 09/17/23 2059)  azithromycin (ZITHROMAX) tablet 500 mg (500 mg Oral Given 09/17/23 2058)     IMPRESSION / MDM / ASSESSMENT AND PLAN / ED COURSE  I reviewed the triage vital signs and the nursing notes.  Patient's presentation is most consistent with acute presentation with potential threat to life or bodily function.  Patient presents to the emergency department for cough congestion over the past 7 to 8 days initially with low-grade fever but no longer febrile.  Patient's workup shows a slight leukocytosis on CBC, reassuring chemistry besides mild hypokalemia, COVID/flu/RSV is negative chest x-ray is clear.  Patient does have a frequent cough during examination suspect more acute bronchitis.  Given the scleral injection and mild amount of discharge we will also place patient on ofloxacin drops in addition to Zithromax to cover for bronchitis.  Patient is agreeable to plan of care.  Will follow-up with his PCP.  FINAL CLINICAL IMPRESSION(S) / ED DIAGNOSES   Upper respiratory infection Acute  bronchitis Conjunctivitis    Note:  This document was prepared using Dragon voice recognition software and may include unintentional dictation errors.   Minna Antis, MD 09/17/23 2136

## 2023-09-17 NOTE — ED Triage Notes (Addendum)
Pt presents for low grade fevers, SOB, general malaise, poor appetite x 1 week.  Also reports drainage from eyes and redness x 1 day
